# Patient Record
Sex: Male | Born: 1975 | Race: White | Hispanic: No | Marital: Married | Smoking: Former smoker
Health system: Southern US, Community
[De-identification: ages and names within clinical notes are randomized; demographics above are authoritative.]

## PROBLEM LIST (undated history)

## (undated) DIAGNOSIS — K219 Gastro-esophageal reflux disease without esophagitis: Secondary | ICD-10-CM

---
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
## 1997-05-07 HISTORY — PX: MOUTH SURGERY: SHX715

## 2010-09-13 ENCOUNTER — Ambulatory Visit: Payer: Self-pay | Admitting: Unknown Physician Specialty

## 2011-04-06 ENCOUNTER — Ambulatory Visit: Payer: Self-pay | Admitting: Internal Medicine

## 2011-05-08 HISTORY — PX: SHOULDER ARTHROSCOPY: SHX128

## 2011-06-25 ENCOUNTER — Other Ambulatory Visit: Payer: Self-pay | Admitting: Orthopedic Surgery

## 2011-06-25 ENCOUNTER — Encounter (HOSPITAL_BASED_OUTPATIENT_CLINIC_OR_DEPARTMENT_OTHER): Payer: Self-pay | Admitting: *Deleted

## 2011-06-27 ENCOUNTER — Ambulatory Visit (HOSPITAL_BASED_OUTPATIENT_CLINIC_OR_DEPARTMENT_OTHER): Payer: BC Managed Care – PPO | Admitting: Anesthesiology

## 2011-06-27 ENCOUNTER — Encounter (HOSPITAL_BASED_OUTPATIENT_CLINIC_OR_DEPARTMENT_OTHER): Payer: Self-pay | Admitting: Anesthesiology

## 2011-06-27 ENCOUNTER — Ambulatory Visit (HOSPITAL_BASED_OUTPATIENT_CLINIC_OR_DEPARTMENT_OTHER)
Admission: RE | Admit: 2011-06-27 | Discharge: 2011-06-27 | Disposition: A | Discharge status: Home Health | Payer: BC Managed Care – PPO | Source: Ambulatory Visit | Attending: Orthopedic Surgery | Admitting: Orthopedic Surgery

## 2011-06-27 ENCOUNTER — Encounter (HOSPITAL_BASED_OUTPATIENT_CLINIC_OR_DEPARTMENT_OTHER): Payer: Self-pay | Admitting: *Deleted

## 2011-06-27 ENCOUNTER — Encounter (HOSPITAL_BASED_OUTPATIENT_CLINIC_OR_DEPARTMENT_OTHER)
Admission: RE | Disposition: A | Discharge status: Home Health | Payer: Self-pay | Source: Ambulatory Visit | Attending: Orthopedic Surgery

## 2011-06-27 DIAGNOSIS — K219 Gastro-esophageal reflux disease without esophagitis: Secondary | ICD-10-CM | POA: Insufficient documentation

## 2011-06-27 DIAGNOSIS — M67919 Unspecified disorder of synovium and tendon, unspecified shoulder: Secondary | ICD-10-CM | POA: Insufficient documentation

## 2011-06-27 DIAGNOSIS — M719 Bursopathy, unspecified: Secondary | ICD-10-CM | POA: Insufficient documentation

## 2011-06-27 DIAGNOSIS — M25819 Other specified joint disorders, unspecified shoulder: Secondary | ICD-10-CM | POA: Insufficient documentation

## 2011-06-27 HISTORY — DX: Gastro-esophageal reflux disease without esophagitis: K21.9

## 2011-06-27 SURGERY — SHOULDER ARTHROSCOPY WITH SUBACROMIAL DECOMPRESSION
Anesthesia: General | Site: Shoulder | Laterality: Left | Wound class: Clean

## 2011-06-27 MED ORDER — BUPIVACAINE-EPINEPHRINE 0.5% -1:200000 IJ SOLN
INTRAMUSCULAR | Status: DC | PRN
  Administered 2011-06-27: 10 mL

## 2011-06-27 MED ORDER — LACTATED RINGERS IV SOLN: INTRAVENOUS | Status: DC

## 2011-06-27 MED ORDER — MIDAZOLAM HCL 2 MG/2ML IJ SOLN
1.0000 mg | Freq: Once | INTRAMUSCULAR | Status: AC
  Administered 2011-06-27: 2 mg via INTRAVENOUS

## 2011-06-27 MED ORDER — DEXAMETHASONE SODIUM PHOSPHATE 4 MG/ML IJ SOLN
INTRAMUSCULAR | Status: DC | PRN
  Administered 2011-06-27: 10 mg via INTRAVENOUS

## 2011-06-27 MED ORDER — POVIDONE-IODINE 7.5 % EX SOLN: Freq: Once | CUTANEOUS | Status: DC

## 2011-06-27 MED ORDER — SODIUM CHLORIDE 0.9 % IR SOLN
Status: DC | PRN
  Administered 2011-06-27: 13:00:00

## 2011-06-27 MED ORDER — MIDAZOLAM HCL 2 MG/2ML IJ SOLN
2.0000 mg | Freq: Once | INTRAMUSCULAR | Status: AC
  Administered 2011-06-27: 2 mg via INTRAVENOUS

## 2011-06-27 MED ORDER — ONDANSETRON HCL 4 MG/2ML IJ SOLN
INTRAMUSCULAR | Status: DC | PRN
  Administered 2011-06-27: 4 mg via INTRAVENOUS

## 2011-06-27 MED ORDER — EPHEDRINE SULFATE 50 MG/ML IJ SOLN
INTRAMUSCULAR | Status: DC | PRN
  Administered 2011-06-27: 10 mg via INTRAVENOUS

## 2011-06-27 MED ORDER — FENTANYL CITRATE 0.05 MG/ML IJ SOLN
INTRAMUSCULAR | Status: DC | PRN
  Administered 2011-06-27 (×2): 25 ug via INTRAVENOUS
  Administered 2011-06-27: 50 ug via INTRAVENOUS

## 2011-06-27 MED ORDER — FENTANYL CITRATE 0.05 MG/ML IJ SOLN: 25.0000 ug | INTRAMUSCULAR | Status: DC | PRN

## 2011-06-27 MED ORDER — LACTATED RINGERS IV SOLN
INTRAVENOUS | Status: DC
  Administered 2011-06-27 (×3): via INTRAVENOUS

## 2011-06-27 MED ORDER — FENTANYL CITRATE 0.05 MG/ML IJ SOLN
50.0000 ug | Freq: Once | INTRAMUSCULAR | Status: AC
  Administered 2011-06-27: 100 ug via INTRAVENOUS

## 2011-06-27 MED ORDER — PROMETHAZINE HCL 25 MG/ML IJ SOLN: 6.2500 mg | INTRAMUSCULAR | Status: DC | PRN

## 2011-06-27 MED ORDER — LIDOCAINE HCL (CARDIAC) 20 MG/ML IV SOLN
INTRAVENOUS | Status: DC | PRN
  Administered 2011-06-27: 80 mg via INTRAVENOUS

## 2011-06-27 MED ORDER — PROPOFOL 10 MG/ML IV EMUL
INTRAVENOUS | Status: DC | PRN
  Administered 2011-06-27: 200 mg via INTRAVENOUS

## 2011-06-27 MED ORDER — OXYCODONE-ACETAMINOPHEN 5-325 MG PO TABS: 1.0000 | ORAL_TABLET | ORAL | Status: AC | PRN

## 2011-06-27 MED ORDER — SUCCINYLCHOLINE CHLORIDE 20 MG/ML IJ SOLN
INTRAMUSCULAR | Status: DC | PRN
  Administered 2011-06-27: 140 mg via INTRAVENOUS

## 2011-06-27 MED ORDER — METHOCARBAMOL 500 MG PO TABS: 500.0000 mg | ORAL_TABLET | Freq: Four times a day (QID) | ORAL | Status: AC | PRN

## 2011-06-27 MED ORDER — MIDAZOLAM HCL 2 MG/2ML IJ SOLN: 1.0000 mg | Freq: Once | INTRAMUSCULAR | Status: DC

## 2011-06-27 SURGICAL SUPPLY — 69 items
BENZOIN TINCTURE PRP APPL 2/3 (GAUZE/BANDAGES/DRESSINGS) ×3 IMPLANT
BLADE 4.2CUDA (BLADE) ×3 IMPLANT
BLADE CUDA 4.2 (BLADE) IMPLANT
BLADE CUDA 5.5 (BLADE) IMPLANT
BLADE CUDA SHAVER 3.5 (BLADE) IMPLANT
BLADE CUTTER GATOR 3.5 (BLADE) IMPLANT
BLADE FLAT COURSE (BLADE) IMPLANT
BLADE GREAT WHITE 4.2 (BLADE) IMPLANT
BUR OVAL 4.0 (BURR) ×3 IMPLANT
CANISTER SUCT LVC 12 LTR MEDI- (MISCELLANEOUS) ×3 IMPLANT
CANISTER SUCTION 1200CC (MISCELLANEOUS) IMPLANT
CANISTER SUCTION 2500CC (MISCELLANEOUS) ×9 IMPLANT
CLOTH BEACON ORANGE TIMEOUT ST (SAFETY) ×3 IMPLANT
DRAPE LG THREE QUARTER DISP (DRAPES) ×6 IMPLANT
DRAPE SHOULDER BEACH CHAIR (DRAPES) ×3 IMPLANT
DRAPE U-SHAPE 47X51 STRL (DRAPES) ×3 IMPLANT
DRSG ADAPTIC 3X8 NADH LF (GAUZE/BANDAGES/DRESSINGS) ×3 IMPLANT
DRSG EMULSION OIL 3X16 NADH (GAUZE/BANDAGES/DRESSINGS) ×3 IMPLANT
DRSG PAD ABDOMINAL 8X10 ST (GAUZE/BANDAGES/DRESSINGS) ×3 IMPLANT
DURAPREP 26ML APPLICATOR (WOUND CARE) ×3 IMPLANT
ELECT MENISCUS 165MM 90D (ELECTRODE) IMPLANT
ELECT REM PT RETURN 9FT ADLT (ELECTROSURGICAL) ×3
ELECTRODE REM PT RTRN 9FT ADLT (ELECTROSURGICAL) ×2 IMPLANT
GLOVE BIOGEL M 6.5 STRL (GLOVE) ×3 IMPLANT
GLOVE BIOGEL PI IND STRL 6.5 (GLOVE) ×2 IMPLANT
GLOVE BIOGEL PI IND STRL 8 (GLOVE) ×2 IMPLANT
GLOVE BIOGEL PI INDICATOR 6.5 (GLOVE) ×1
GLOVE BIOGEL PI INDICATOR 8 (GLOVE) ×1
GLOVE ECLIPSE 6.0 STRL STRAW (GLOVE) ×3 IMPLANT
GLOVE ECLIPSE 8.0 STRL XLNG CF (GLOVE) ×6 IMPLANT
GLOVE INDICATOR 8.0 STRL GRN (GLOVE) ×3 IMPLANT
GOWN PREVENTION PLUS LG XLONG (DISPOSABLE) ×6 IMPLANT
GOWN STRL REIN XL XLG (GOWN DISPOSABLE) ×6 IMPLANT
IV NS IRRIG 3000ML ARTHROMATIC (IV SOLUTION) ×6 IMPLANT
NDL SAFETY ECLIPSE 18X1.5 (NEEDLE) ×2 IMPLANT
NEEDLE 1/2 CIR CATGUT .05X1.09 (NEEDLE) IMPLANT
NEEDLE HYPO 18GX1.5 BLUNT FILL (NEEDLE) ×3 IMPLANT
NEEDLE HYPO 18GX1.5 SHARP (NEEDLE) ×1
NS IRRIG 500ML POUR BTL (IV SOLUTION) ×3 IMPLANT
PACK ARTHROSCOPY DSU (CUSTOM PROCEDURE TRAY) ×3 IMPLANT
PACK BASIN DAY SURGERY FS (CUSTOM PROCEDURE TRAY) ×3 IMPLANT
PENCIL BUTTON HOLSTER BLD 10FT (ELECTRODE) IMPLANT
SET ARTHROSCOPY TUBING (MISCELLANEOUS) ×1
SET ARTHROSCOPY TUBING LN (MISCELLANEOUS) ×2 IMPLANT
SLING ARM IMMOBILIZER LRG (SOFTGOODS) ×3 IMPLANT
SLING ARM IMMOBILIZER XL (CAST SUPPLIES) ×3 IMPLANT
SPONGE GAUZE 4X4 12PLY (GAUZE/BANDAGES/DRESSINGS) ×3 IMPLANT
SPONGE SURGIFOAM ABS GEL 100 (HEMOSTASIS) IMPLANT
STAPLER VISISTAT 35W (STAPLE) IMPLANT
STRIP CLOSURE SKIN 1/2X4 (GAUZE/BANDAGES/DRESSINGS) ×3 IMPLANT
SUCTION FRAZIER TIP 10 FR DISP (SUCTIONS) IMPLANT
SUT BONE WAX W31G (SUTURE) IMPLANT
SUT ETHIBOND GREEN BRAID 0S 4 (SUTURE) IMPLANT
SUT ETHIBOND NAB CT1 #1 30IN (SUTURE) IMPLANT
SUT ETHILON 4 0 PS 2 18 (SUTURE) IMPLANT
SUT VIC AB 0 CT1 36 (SUTURE) IMPLANT
SUT VIC AB 1 CT1 36 (SUTURE) ×6 IMPLANT
SUT VIC AB 2-0 CT1 27 (SUTURE) ×2
SUT VIC AB 2-0 CT1 TAPERPNT 27 (SUTURE) ×4 IMPLANT
SUT VIC AB 3-0 SH 27 (SUTURE)
SUT VIC AB 3-0 SH 27X BRD (SUTURE) IMPLANT
SYR BULB IRRIGATION 50ML (SYRINGE) ×3 IMPLANT
SYRINGE 10CC LL (SYRINGE) ×3 IMPLANT
TAPE CLOTH SURG 6X10 WHT LF (GAUZE/BANDAGES/DRESSINGS) ×3 IMPLANT
TAPE HYPAFIX 6X30 (GAUZE/BANDAGES/DRESSINGS) ×3 IMPLANT
TOWEL OR 17X24 6PK STRL BLUE (TOWEL DISPOSABLE) ×6 IMPLANT
TUBE CONNECTING 12X1/4 (SUCTIONS) ×3 IMPLANT
WAND 90 DEG TURBOVAC W/CORD (SURGICAL WAND) ×3 IMPLANT
WATER STERILE IRR 500ML POUR (IV SOLUTION) ×3 IMPLANT

## 2011-06-27 NOTE — Discharge Instructions (Signed)
Wear sling for comfort, ice to shoulder as needed.

## 2011-06-27 NOTE — Anesthesia Postprocedure Evaluation (Signed)
  Anesthesia Post-op Note  Patient: Jason Love  Procedure(s) Performed: Procedure(s) (LRB): SHOULDER ARTHROSCOPY WITH SUBACROMIAL DECOMPRESSION (Left)  Patient Location: PACU  Anesthesia Type: General  Level of Consciousness: awake and alert   Airway and Oxygen Therapy: Patient Spontanous Breathing  Post-op Pain: mild  Post-op Assessment: Post-op Vital signs reviewed, Patient's Cardiovascular Status Stable, Respiratory Function Stable, Patent Airway and No signs of Nausea or vomiting  Post-op Vital Signs: stable  Complications: No apparent anesthesia complications

## 2011-06-27 NOTE — Brief Op Note (Signed)
06/27/2011  6:16 PM  PATIENT:  Heywood Bene  36 y.o. male  PRE-OPERATIVE DIAGNOSIS:  left shoulder impingement  POST-OPERATIVE DIAGNOSIS:  left shoulder impingement  PROCEDURE:  Procedure(s) (LRB): SHOULDER ARTHROSCOPY WITH SUBACROMIAL DECOMPRESSION (Left)  SURGEON:  Surgeon(s) and Role:    * Drucilla Schmidt, MD - Primary  PHYSICIAN ASSISTANT:   ASSISTANTS: Mr Idolina Primer  ANESTHESIA:   regional and general  EBL:  Total I/O In: 2130 [P.O.:480; I.V.:1650] Out: -   BLOOD ADMINISTERED:none  DRAINS: none   LOCAL MEDICATIONS USED:  MARCAINE     SPECIMEN:  No Specimen  DISPOSITION OF SPECIMEN:  N/A  COUNTS:  YES  TOURNIQUET:  * No tourniquets in log *  DICTATION: .Other Dictation: Dictation Number [id number]  PLAN OF CARE: Discharge to home after PACU  PATIENT DISPOSITION:  PACU - hemodynamically stable.   Delay start of Pharmacological VTE agent (>24hrs) due to surgical blood loss or risk of bleeding: not applicable

## 2011-06-27 NOTE — Anesthesia Procedure Notes (Addendum)
Procedure Name: Intubation Date/Time: 06/27/2011 12:29 PM Performed by: Lorrin Jackson Pre-anesthesia Checklist: Patient identified, Emergency Drugs available, Suction available and Patient being monitored Patient Re-evaluated:Patient Re-evaluated prior to inductionOxygen Delivery Method: Circle System Utilized Preoxygenation: Pre-oxygenation with 100% oxygen Intubation Type: IV induction Ventilation: Mask ventilation without difficulty Laryngoscope Size: Mac and 4 Grade View: Grade I Tube type: Oral Tube size: 8.0 mm Number of attempts: 1 Airway Equipment and Method: stylet,  oral airway and LTA Placement Confirmation: ETT inserted through vocal cords under direct vision,  positive ETCO2 and breath sounds checked- equal and bilateral Tube secured with: Tape Dental Injury: Teeth and Oropharynx as per pre-operative assessment

## 2011-06-27 NOTE — H&P (Signed)
Demico Ploch is an 36 y.o. male.   Chief Complaint:painful lt shoulder HPI: possible labral tear on mri  Past Medical History  Diagnosis Date  . GERD (gastroesophageal reflux disease)     mild-no meds used    Past Surgical History  Procedure Date  . Knee arthroscopy 1995  . Mouth surgery 1999    History reviewed. No pertinent family history. Social History:  reports that he quit smoking about 6 years ago. He does not have any smokeless tobacco history on file. His alcohol and drug histories not on file.  Allergies: No Known Allergies  Medications Prior to Admission  Medication Dose Route Frequency Provider Last Rate Last Dose  . lactated ringers infusion   Intravenous Continuous Riesa Pope, MD 100 mL/hr at 06/27/11 1115    . midazolam (VERSED) injection 1 mg  1 mg Intravenous Once Gaetano Hawthorne, MD      . midazolam (VERSED) injection 1 mg  1 mg Intravenous Once Gaetano Hawthorne, MD   2 mg at 06/27/11 1155  . midazolam (VERSED) injection 2 mg  2 mg Intravenous Once Gaetano Hawthorne, MD   2 mg at 06/27/11 1145  . povidone-iodine (BETADINE) 7.5 % scrub   Topical Once Drucilla Schmidt, MD       No current outpatient prescriptions on file as of 06/27/2011.    Results for orders placed during the hospital encounter of 06/27/11 (from the past 48 hour(s))  POCT HEMOGLOBIN-HEMACUE     Status: Normal   Collection Time   06/27/11 11:22 AM      Component Value Range Comment   Hemoglobin 16.3  13.0 - 17.0 (g/dL)    No results found.  ROS  Blood pressure 101/55, pulse 60, temperature 97 F (36.1 C), temperature source Oral, resp. rate 20, height 6' (1.829 m), weight 79.379 kg (175 lb), SpO2 100.00%. Physical Exam  Constitutional: He is oriented to person, place, and time. He appears well-developed and well-nourished.  HENT:  Head: Normocephalic and atraumatic.  Right Ear: External ear normal.  Left Ear: External ear normal.  Nose: Nose normal.  Mouth/Throat: Oropharynx is  clear and moist.  Eyes: Conjunctivae and EOM are normal. Pupils are equal, round, and reactive to light.  Neck: Normal range of motion. Neck supple.  Cardiovascular: Normal rate, regular rhythm, normal heart sounds and intact distal pulses.   Respiratory: Effort normal and breath sounds normal.  GI: Soft. Bowel sounds are normal.  Musculoskeletal: Normal range of motion.       Tender rotator cuff;sensation of popping with rotation of arm  Neurological: He is alert and oriented to person, place, and time. He has normal reflexes.  Skin: Skin is warm and dry.  Psychiatric: He has a normal mood and affect. His behavior is normal. Judgment and thought content normal.     Assessment/Plan Rotator cuff impingement and possible labral tear lt shoulder Lt shoulder arthroscopy with possible labral debridement and SAD  Deshawn Skelley P 06/27/2011, 12:12 PM

## 2011-06-27 NOTE — Transfer of Care (Signed)
Immediate Anesthesia Transfer of Care Note  Patient: Jason Love  Procedure(s) Performed: Procedure(s) (LRB): SHOULDER ARTHROSCOPY WITH SUBACROMIAL DECOMPRESSION (Left)  Patient Location: Patient transported to PACU with oxygen via face mask at 4 Liters / Min  Anesthesia Type: General  Level of Consciousness: awake and alert   Airway & Oxygen Therapy: Patient Spontanous Breathing and Patient connected to face mask oxygen  Post-op Assessment: Report given to PACU RN and Post -op Vital signs reviewed and stable  Post vital signs: Reviewed and stable  Dentition: Teeth and oropharynx remain in pre-op condition  Complications: No apparent anesthesia complications

## 2011-06-27 NOTE — Anesthesia Preprocedure Evaluation (Signed)
Anesthesia Evaluation  Patient identified by MRN, date of birth, ID band Patient awake    Reviewed: Allergy & Precautions, H&P , NPO status , Patient's Chart, lab work & pertinent test results  Airway Mallampati: II TM Distance: >3 FB Neck ROM: full    Dental No notable dental hx. (+) Teeth Intact and Dental Advisory Given   Pulmonary neg pulmonary ROS,  clear to auscultation  Pulmonary exam normal       Cardiovascular Exercise Tolerance: Good neg cardio ROS regular Normal    Neuro/Psych Negative Neurological ROS  Negative Psych ROS   GI/Hepatic negative GI ROS, Neg liver ROS,   Endo/Other  Negative Endocrine ROS  Renal/GU negative Renal ROS  Genitourinary negative   Musculoskeletal   Abdominal   Peds  Hematology negative hematology ROS (+)   Anesthesia Other Findings   Reproductive/Obstetrics negative OB ROS                          Anesthesia Physical Anesthesia Plan  ASA: I  Anesthesia Plan: General   Post-op Pain Management:    Induction: Intravenous  Airway Management Planned: Oral ETT  Additional Equipment:   Intra-op Plan:   Post-operative Plan: Extubation in OR  Informed Consent: I have reviewed the patients History and Physical, chart, labs and discussed the procedure including the risks, benefits and alternatives for the proposed anesthesia with the patient or authorized representative who has indicated his/her understanding and acceptance.   Dental Advisory Given  Plan Discussed with: CRNA and Surgeon  Anesthesia Plan Comments:         Anesthesia Quick Evaluation  

## 2011-07-02 NOTE — Op Note (Signed)
NAME:  Jason Love, Jason Love NO.:  [id number]  MEDICAL RECORD NO.:  [id number]  LOCATION:                               FACILITY:  [facility]  PHYSICIAN:  Marlowe Kays, M.D.  DATE OF BIRTH:  03-27-1976  DATE OF PROCEDURE:  06/27/2011 DATE OF DISCHARGE:                              OPERATIVE REPORT   PREOPERATIVE DIAGNOSIS:  Painful left shoulder with: 1. Possible labral tear. 2. Chronic impingement syndrome.  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  OPERATION:  Left shoulder arthroscopy with inspection of the glenohumeral joint (within normal limits) and arthroscopic subacromial decompression.  SURGEON:  Marlowe Kays, M.D.  ASSISTANTDruscilla Brownie. Cherlynn June.  ANESTHESIA:  General preceded by interscalene block.  PATHOLOGY AND JUSTIFICATION FOR PROCEDURE:  Because of painful left shoulder, he had MRI performed, which looked relatively normal with possible labral tear.  Certainly symptomatically, he had rotator cuff pathology with pain and tenderness about the entire rotator cuff as well as loss of function.  PROCEDURE IN DETAIL:  Satisfactory interscalene block by anesthesia, [name] assistance was necessary to help hold the arm, manipulate the arm, and assist with retraction.  Beach-chair position on the sliding frame, left shoulder girdle was prepped with the DuraPrep, draped in sterile field.  Anatomy of the shoulder joint was marked out and for hemostatic purposes, site for posterior and lateral portals and subacromial space were infiltrated with Marcaine with adrenaline.  Left shoulder was draped in a sterile field.  Time-out performed.  First through a posterior portal, the glenohumeral joint was entered with findings being normal.  Representative pictures were taken.  There was no labral tear.  There was a small indentation, which was superficial over the posterior rim of the glenoid, but this  was superficial and there was no evidence for any inflammatory or pathologic changes in terms of inflammation or injury and attempted repair.  I then redirected the scope in the subacromial space.  There was marked inflammatory reaction there with abrasion of the rotator cuff.  Prominent coracoacromial ligament was also noted.  Through a lateral portal, I introduced a 4.2 shaver and began cleaning up the soft tissue in the bursa and then followed this with the ArthroCare 90-degree vaporizer removing soft tissue from the undersurface of the acromion.  We then brought in a 4- mm oval bur and began burring down the very tight impingement in the subacromial space.  I worked back and forth between the bur and the vaporizer with a 4.2 shaver until we had a nice wide decompression with a clean subacromial space.  All fluid possible was removed from the subacromial space and the 2 portals were closed with 4-0 nylon followed by Betadine, Adaptic, dry sterile dressing, and shoulder immobilizer. He tolerated the procedure well, was taken to recovery room in satisfactory condition with no known complications.          ______________________________ Marlowe Kays, M.D.     JA/MEDQ  D:  06/27/2011  T:  06/28/2011  Job:  [id number]

## 2015-02-11 DIAGNOSIS — R030 Elevated blood-pressure reading, without diagnosis of hypertension: Secondary | ICD-10-CM

## 2015-02-11 DIAGNOSIS — K219 Gastro-esophageal reflux disease without esophagitis: Secondary | ICD-10-CM | POA: Insufficient documentation

## 2015-02-11 DIAGNOSIS — IMO0001 Reserved for inherently not codable concepts without codable children: Secondary | ICD-10-CM | POA: Insufficient documentation

## 2016-01-30 DIAGNOSIS — S46019A Strain of muscle(s) and tendon(s) of the rotator cuff of unspecified shoulder, initial encounter: Secondary | ICD-10-CM | POA: Insufficient documentation

## 2016-01-30 DIAGNOSIS — M7710 Lateral epicondylitis, unspecified elbow: Secondary | ICD-10-CM | POA: Insufficient documentation

## 2016-03-06 DIAGNOSIS — E7800 Pure hypercholesterolemia, unspecified: Secondary | ICD-10-CM | POA: Insufficient documentation

## 2016-04-02 ENCOUNTER — Ambulatory Visit (INDEPENDENT_AMBULATORY_CARE_PROVIDER_SITE_OTHER): Payer: BC Managed Care – PPO | Admitting: Orthopaedic Surgery

## 2016-04-02 ENCOUNTER — Encounter (INDEPENDENT_AMBULATORY_CARE_PROVIDER_SITE_OTHER): Payer: Self-pay | Admitting: Orthopaedic Surgery

## 2016-04-02 VITALS — BP 138/76 | HR 68 | Resp 14 | Ht 71.5 in | Wt 185.0 lb

## 2016-04-02 DIAGNOSIS — M25521 Pain in right elbow: Secondary | ICD-10-CM

## 2016-04-02 DIAGNOSIS — G8929 Other chronic pain: Secondary | ICD-10-CM

## 2016-04-02 DIAGNOSIS — M25511 Pain in right shoulder: Secondary | ICD-10-CM

## 2016-04-02 NOTE — Progress Notes (Signed)
Office Visit Note   Patient: Jason Love           Date of Birth: 10/03/1975           MRN: [id number] Visit Date: 04/02/2016              Requested by: No referring provider defined for this encounter. PCP: BABAOFF, Lavada MesiMARC E, MD   Assessment & Plan: Visit Diagnoses: No diagnosis found. Right shoulder pain appears to be localized around the acromioclavicular joint with what appears to be a sprain by MRI scan.  I also think he has a mild lateral epicondylitis the right elbow.. We had a long discussion over 30-40 minutes regarding his problem. He would prefer not to have cortisone injection at either the elbow or the shoulder but monitored response to the Medrol Dosepak which she is been on only several days. I have discuss possible surgery to the shoulder for distal clavicle resection. I have given him samples of Pennsaid gel to apply to the shoulder and the elbow. He like to return in 2 weeks for follow-up evaluation. I agree with no work until then. He was referred by his attorney ., Mr. Tressia MinersDoug Harris.  Follow-Up Instructions: No Follow-up on file.   Orders:  No orders of the defined types were placed in this encounter.  No orders of the defined types were placed in this encounter.     Procedures: No procedures performed   Clinical Data: No additional findings.   Subjective: No chief complaint on file.   Pt fell off a 4' ladder, landed with arms pinned behind him in August 2017 and has not resolved.   MRI completed 12/2015.   Injury was sustained on the job while working at [employer]. Mr. Christell ConstantMoore states going up a ladder. The latter slipped and he fell landing backwards. There was a possible loss of consciousness momentarily but he does not remember hitting his head. He initially had pain in his neck  and both shoulders presently he is having pain in his right elbow and right shoulder with difficulty performing overhead activities. He has not worked in 2 weeks. There is no  light-duty work available for him. He [place]. He continued a course of physical therapy for approximately 1 month. Presently he is on prednisone. He did have an MRI scan performed through [facility]. I have a copy of the report dated 03/12/2016 demonstrating a grade 1 acromioclavicular joint sprain with mild acromioclavicular joint arthritis. There was supraspinatus tendinosis without a rotator cuff tear there was mild edema surrounding the acromioclavicular joint associated with osteophytes.  He also is continued to complain of pain along the lateral aspect of his right elbow particularly with grip in extension. Review of Systems   Objective: Vital Signs: There were no vitals taken for this visit.  Physical Exam  Ortho Exam examination of the right shoulder demonstrates mild to moderate pain at the level of the acromioclavicular joint. He did have a painful overhead arc of motion without loss of motion. There was mild positive impingement on external rotation. Biceps was intact. Skin was intact. Neurovascular exam was intact.  There was local tenderness at the lateral aspect of the right elbow with increased pain with grip and extension.  Specialty Comments:  No specialty comments available.  Imaging: No results found.   PMFS History: There are no active problems to display for this patient.  Past Medical History:  Diagnosis Date  .  GERD (gastroesophageal reflux disease)    mild-no meds used    No family history on file.  Past Surgical History:  Procedure Laterality Date  . KNEE ARTHROSCOPY  1995  . MOUTH SURGERY  1999   Social History   Occupational History  . Not on file.   Social History Main Topics  . Smoking status: Former Smoker    Quit date: 04/23/2005  . Smokeless tobacco: Not on file  . Alcohol use Not on file     Comment: rare  . Drug use: Unknown  . Sexual activity: Not on file

## 2016-04-20 ENCOUNTER — Ambulatory Visit (INDEPENDENT_AMBULATORY_CARE_PROVIDER_SITE_OTHER): Payer: BC Managed Care – PPO | Admitting: Orthopaedic Surgery

## 2016-04-20 ENCOUNTER — Encounter (INDEPENDENT_AMBULATORY_CARE_PROVIDER_SITE_OTHER): Payer: Self-pay | Admitting: Orthopaedic Surgery

## 2016-04-20 VITALS — BP 138/76 | HR 70 | Resp 14 | Ht 71.5 in | Wt 185.0 lb

## 2016-04-20 DIAGNOSIS — M25511 Pain in right shoulder: Secondary | ICD-10-CM | POA: Diagnosis not present

## 2016-04-20 DIAGNOSIS — G8929 Other chronic pain: Secondary | ICD-10-CM

## 2016-04-20 NOTE — Progress Notes (Signed)
   Office Visit Note   Patient: Jason Love           Date of Birth: 06/07/1975           MRN: [id number] Visit Date: 04/20/2016              Requested by: Kandyce RudMarcus Babaoff, MD [id number] [facility] [place] [facility] [place], [place] [id number] PCP: Rozanna BoxBABAOFF, MARC E, MD   Assessment & Plan: Visit Diagnoses: Right shoulder impingement with possible partial rotator cuff tear  Plan: Mr. Christell ConstantMoore will continue to be followed by his workman's comp orthopedist [place] with the planned physical therapy. He continues to be out of work through his other orthopedist. I agree. He would like to return for further evaluation after physical therapy  Follow-Up Instructions: No Follow-up on file.   Orders:  No orders of the defined types were placed in this encounter.  No orders of the defined types were placed in this encounter.     Procedures: No procedures performed   Clinical Data: No additional findings.   Subjective: No chief complaint on file.   Pt here with recurring Right shoulder pain. Workman's Comp [facility]. Disk provided  I did review the new MRI scan with contrast. There appears to be a small tear of the supraspinatus that I suspect is partial thickness. I did not see a labral tear.  Review of Systems   Objective: Vital Signs: There were no vitals taken for this visit.  Physical Exam  Ortho Exam right shoulder exam is consistent with impingement. There was pain on the extreme of external rotation. There is a painful arc with overhead motion. As of empty can testing. No weakness. Neurovascular exam intact. No neck pain.  Specialty Comments:  No specialty comments available.  Imaging: No results found.   PMFS History: There are no active problems to display for this patient.  Past Medical History:  Diagnosis Date  . GERD (gastroesophageal reflux disease)    mild-no meds used    No family  history on file.  Past Surgical History:  Procedure Laterality Date  . KNEE ARTHROSCOPY  1995  . MOUTH SURGERY  1999   Social History   Occupational History  . Not on file.   Social History Main Topics  . Smoking status: Former Smoker    Quit date: 04/23/2005  . Smokeless tobacco: Never Used  . Alcohol use Not on file     Comment: rare  . Drug use: No  . Sexual activity: Yes

## 2016-08-22 DIAGNOSIS — M25519 Pain in unspecified shoulder: Secondary | ICD-10-CM | POA: Insufficient documentation

## 2016-09-21 ENCOUNTER — Ambulatory Visit (INDEPENDENT_AMBULATORY_CARE_PROVIDER_SITE_OTHER): Payer: BC Managed Care – PPO | Admitting: Orthopaedic Surgery

## 2016-09-21 ENCOUNTER — Encounter (INDEPENDENT_AMBULATORY_CARE_PROVIDER_SITE_OTHER): Payer: Self-pay | Admitting: Orthopaedic Surgery

## 2016-09-21 ENCOUNTER — Encounter (INDEPENDENT_AMBULATORY_CARE_PROVIDER_SITE_OTHER): Payer: Self-pay

## 2016-09-21 VITALS — BP 140/74 | HR 97 | Resp 14 | Ht 72.0 in | Wt 185.0 lb

## 2016-09-21 DIAGNOSIS — G8929 Other chronic pain: Secondary | ICD-10-CM | POA: Diagnosis not present

## 2016-09-21 DIAGNOSIS — M25511 Pain in right shoulder: Secondary | ICD-10-CM

## 2016-09-21 NOTE — Progress Notes (Signed)
Office Visit Note   Patient: Jason Love           Date of Birth: 10/05/1975           MRN: [id number] Visit Date: 09/21/2016              Requested by: Kandyce RudBabaoff, Marcus, MD [address] [facility] [place] [facility] [place], [place] [id number] PCP: Kandyce RudBabaoff, Marcus, MD   Assessment & Plan: Visit Diagnoses:  1. 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 Workmen's Comp. injury  Plan: I recommend right shoulder surgery do include an arthroscopic subacromial decompression, distal clavicle resection and possible mini open rotator cuff tear repair. This is discussed this in detail with him regarding the surgery outpatient nature, type of anesthesia. I have also discussed the potential for significant relief of pain versus incomplete relief of pain. I think he fully understands the potential limitations and would like to proceed.Marland Kitchen. He is aware that because this is a workman's comp injury that it needs to be processed through [employer]. and/or his attorney.  Follow-Up Instructions: Return will schedule surgery.   Orders:  No orders of the defined types were placed in this encounter.  No orders of the defined types were placed in this encounter.     Procedures: No procedures performed   Clinical Data: No additional findings.   Subjective: Chief Complaint  Patient presents with  . Right Shoulder - Pain    Jason Love is a 41 y o that presents with Right shoulder impingement with possible partial rotator cuff tear. Workman's Comp.Ready to discuss R shoulder shoulder surgery. [place]  Jason Love sustained an on-the-job injury to his left shoulder in August 2017. He continues to have pain to the point of compromise. He recently returned to work and felt that he could not perform his regular activities because of the shoulder pain. He's been  through a course of physical therapy, try over-the-counter medicines. He had an MRI scan that demonstrated partial rotator cuff tear and degenerative changes at the acromioclavicular joint. He's having difficulty raising his arm over his head and even sleeping on that shoulder. Denies any neck pain or numbness or tingling distally. He is at the point where he wants to discuss "surgery". He has been followed [place]oped[place] but wishes to transfer his care to our office.  HPI  Review of Systems   Objective: Vital Signs: BP 140/74   Pulse 97   Resp 14   Ht 6' (1.829 m)   Wt 185 lb (83.9 kg)   BMI 25.09 kg/m   Physical Exam  Ortho Exam right shoulder with full passive and active overhead motion. Circulators motion raising his arm and flexion. Abduction is uncomfortable along the anterior lateral subacromial region. Some pain at the acromioclavicular joint. Biceps intact. Good grip and good release. Good strength. Difficulty maintaining his arm in flexion and abduction related to his pain  Specialty Comments:  No specialty comments available.  Imaging: No results found.   PMFS History: Patient Active Problem List   Diagnosis Date Noted  . Shoulder pain 08/22/2016  . Pure hypercholesterolemia 03/06/2016  . Strain of rotator cuff capsule 01/30/2016  . Lateral epicondylitis 01/30/2016  . Blood pressure elevated 02/11/2015  . GERD without esophagitis 02/11/2015   Past Medical History:  Diagnosis Date  . GERD (gastroesophageal reflux disease)  mild-no meds used    No family history on file.  Past Surgical History:  Procedure Laterality Date  . KNEE ARTHROSCOPY  1995  . MOUTH SURGERY  1999   Social History   Occupational History  . Not on file.   Social History Main Topics  . Smoking status: Former Smoker    Quit date: 04/23/2005  . Smokeless tobacco: Never Used  . Alcohol use Not on file     Comment: rare  . Drug use: No  . Sexual activity: Yes      Valeria Batman, MD   Note - This record has been created using [employer].  Chart creation errors have been sought, but may not always  have been located. Such creation errors do not reflect on  the standard of medical care.

## 2016-12-04 ENCOUNTER — Telehealth (INDEPENDENT_AMBULATORY_CARE_PROVIDER_SITE_OTHER): Payer: Self-pay | Admitting: Orthopaedic Surgery

## 2016-12-04 NOTE — Telephone Encounter (Signed)
Patient would like doctor to order a nerve conduction study test, and [facility] Anne HahnWillis Fax#[phone]. Please call patient will questions.

## 2016-12-05 NOTE — Telephone Encounter (Signed)
THis is a WC patient. Please advise

## 2016-12-18 ENCOUNTER — Telehealth (INDEPENDENT_AMBULATORY_CARE_PROVIDER_SITE_OTHER): Payer: Self-pay | Admitting: Orthopaedic Surgery

## 2016-12-18 NOTE — Telephone Encounter (Signed)
Called pt and told him there was no mention of EMG in last note. Called pt and told him this. He was confused as to which dr he talked with about this.

## 2016-12-18 NOTE — Telephone Encounter (Signed)
Patient would like to know status of Nerve Conduction Study. Please call to advise.

## 2017-01-02 DIAGNOSIS — R202 Paresthesia of skin: Secondary | ICD-10-CM | POA: Insufficient documentation

## 2017-01-24 DIAGNOSIS — G5621 Lesion of ulnar nerve, right upper limb: Secondary | ICD-10-CM | POA: Insufficient documentation

## 2017-04-05 ENCOUNTER — Ambulatory Visit (INDEPENDENT_AMBULATORY_CARE_PROVIDER_SITE_OTHER): Payer: BC Managed Care – PPO | Admitting: Orthopaedic Surgery

## 2017-04-05 ENCOUNTER — Encounter (INDEPENDENT_AMBULATORY_CARE_PROVIDER_SITE_OTHER): Payer: Self-pay | Admitting: Orthopaedic Surgery

## 2017-04-05 VITALS — BP 127/70 | HR 77 | Resp 14 | Ht 70.0 in | Wt 185.0 lb

## 2017-04-05 DIAGNOSIS — G8929 Other chronic pain: Secondary | ICD-10-CM | POA: Insufficient documentation

## 2017-04-05 DIAGNOSIS — M25511 Pain in right shoulder: Secondary | ICD-10-CM | POA: Diagnosis not present

## 2017-04-05 DIAGNOSIS — G5621 Lesion of ulnar nerve, right upper limb: Secondary | ICD-10-CM | POA: Diagnosis not present

## 2017-04-05 NOTE — Progress Notes (Signed)
Office Visit Note   Patient: Jason Love           Date of Birth: 09/04/1975           MRN: [id number] Visit Date: 04/05/2017              Requested by: Kandyce RudBabaoff, Marcus, MD [address] [facility] [place] [facility] [place], [place] [address] PCP: Kandyce RudBabaoff, Marcus, MD   Assessment & Plan: Visit Diagnoses:  1. Chronic right shoulder pain   2. Cubital tunnel syndrome on right     Plan: Chronic problem with right shoulder as previously identified. This is a workman's comp injury. Recently evaluated at [facility] for cubital tunnel syndrome. Had EMGs and nerve conduction studies consistent with a "mild": Nerve compression. After long discussion Jason Love would like to proceed with the shoulder surgery as previously outlined and release of the ulnar nerve at the right elbow. This was also suggested by the [facility]oped[facility]. Surgery would include an arthroscopic subacromial decompression distal clavicle resection. There is a partial rotator cuff tear this may be patched or debrided or both. Might require an open cuff tear repair  Follow-Up Instructions: Return will schedu;le surgery.   Orders:  No orders of the defined types were placed in this encounter.  No orders of the defined types were placed in this encounter.     Procedures: No procedures performed   Clinical Data: No additional findings.   Subjective: Chief Complaint  Patient presents with  . Right Shoulder - Pain    Jason Love is a 41 y o that presents with R elbow pain with chronic right shoulder pain. [facility], referred to Dr. Luiz BlareGraves and dr said R ulna nerve was pinched. Pt also had EMG for r hand numbness.  . Right Elbow - Pain  Workman's comp injury to right upper extremity as previously outlined. Has evidence of impingement and a partial rotator cuff tear by MRI scan. I had previously suggested an arthroscopic subacromial decompression and distal clavicle resection and  debridement of the partial tear versus open repair. Jason Love would like to proceed. He also is been evaluated at [facility] problems having one the medial aspect of his right elbow consistent with cubital tunnel syndrome. EMGs and nerve conduction studies confirm the above .he is having trouble sleeping and raising his arm over his head. Has been unable to work.  HPI  Review of Systems  Constitutional: Negative for fatigue.  HENT: Negative for hearing loss.   Respiratory: Negative for apnea, chest tightness and shortness of breath.   Cardiovascular: Negative for chest pain, palpitations and leg swelling.  Gastrointestinal: Negative for blood in stool, constipation and diarrhea.  Genitourinary: Negative for difficulty urinating.  Musculoskeletal: Positive for neck stiffness. Negative for arthralgias, back pain, joint swelling, myalgias and neck pain.  Neurological: Negative for weakness, numbness and headaches.  Hematological: Does not bruise/bleed easily.  Psychiatric/Behavioral: Negative for sleep disturbance. The patient is not nervous/anxious.      Objective: Vital Signs: BP 127/70   Pulse 77   Resp 14   Ht 5\' 10"  (1.778 m)   Wt 185 lb (83.9 kg)   BMI 26.54 kg/m   Physical Exam  Ortho Exam awake alert and oriented 3. Comfortable sitting. Positive impingement and empty can testing right shoulder. Painful overhead arc of motion but is able to place his right arm fully overhead. With grip and good release. Positive Tinel's over the  ulnar nerve medial aspect of the right elbow. Has good sensibility to the fingers and has an active abduction and abduction of all of his fingers. No evidence of subluxation of the ulnar nerve at the elbow  Specialty Comments:  No specialty comments available.  Imaging: No results found.   PMFS History: Patient Active Problem List   Diagnosis Date Noted  . Chronic right shoulder pain 04/05/2017  . Cubital tunnel syndrome on right  01/24/2017  . Paresthesias in right hand 01/02/2017  . Shoulder pain 08/22/2016  . Pure hypercholesterolemia 03/06/2016  . Strain of rotator cuff capsule 01/30/2016  . Lateral epicondylitis 01/30/2016  . Blood pressure elevated 02/11/2015  . GERD without esophagitis 02/11/2015   Past Medical History:  Diagnosis Date  . GERD (gastroesophageal reflux disease)    mild-no meds used    History reviewed. No pertinent family history.  Past Surgical History:  Procedure Laterality Date  . KNEE ARTHROSCOPY  1995  . MOUTH SURGERY  1999   Social History   Occupational History  . Not on file  Tobacco Use  . Smoking status: Former Smoker    Last attempt to quit: 04/23/2005    Years since quitting: 11.9  . Smokeless tobacco: Never Used  Substance and Sexual Activity  . Alcohol use: Not on file    Comment: rare  . Drug use: No  . Sexual activity: Yes

## 2017-05-03 ENCOUNTER — Inpatient Hospital Stay (INDEPENDENT_AMBULATORY_CARE_PROVIDER_SITE_OTHER): Payer: BC Managed Care – PPO | Admitting: Orthopaedic Surgery

## 2017-05-17 NOTE — H&P (Addendum)
Norlene Campbell, MD   Jacqualine Code, PA-C [address], [place], [place]  [address]                             [phone]   ORTHOPAEDIC HISTORY & PHYSICAL  Jason Love MRN:  [id number] DOB/SEX:  August 18, 1975/male  CHIEF COMPLAINT:  Painful right shoulder and numbness right hand  HISTORY: 42 year old male who has been seen for chronic problems with the right shoulder for that of impingement and possible cuff tear. He did have initial on-the-job injury [employer] when he was going up a ladder and the latter slipped and he felt landing backwards. Apparently there was a question of momentary unconsciousness. Initially had pain in his neck as well as both shoulders and his right elbow. Chart having problems with his right elbow and right shoulder especially with performing overhead activities. She was seen in the orthopedic clinic in [place] back in 2017. Course of physical therapy was performed and also placed on prednisone. MRI scan at no monitor revealed on 03/12/2016 a grade 1 before meals separation with mild acromioclavicular joint arthritis. There was supraspinatus tendinosis without rotator cuff tear. There was mild edema surrounding the before meals joint so she with osteophytes. Treated her pain and discomfort. Was evaluated at that time Also has been seen previously for cubital tunnel syndrome at [facility]. EMGs were consistent with a mild nerve compression. He had been evaluated on 04/02/2016 and continued as does back and also was given samples of Pennsaid to apply to the shoulder and elbow. He was taken out of work at that time.  Is continue out of work. On 09/21/2016 a suggestion was made to do arthroscopic subacromial decompression with distal clavicle excision and possible mini open rotator cuff repair this had to be approved by workman's comp. There is also seen by Dr. Sanjuana Letters who felt that was cubital tunnel syndrome also.  Because of continued symptoms is now  indicated for a right shoulder SA, DCR, mini open rotator cuff repair, and cubital tunnel release.   PAST MEDICAL HISTORY: Patient Active Problem List   Diagnosis Date Noted  . Chronic right shoulder pain 04/05/2017  . Cubital tunnel syndrome on right 01/24/2017  . Paresthesias in right hand 01/02/2017  . Shoulder pain 08/22/2016  . Pure hypercholesterolemia 03/06/2016  . Strain of rotator cuff capsule 01/30/2016  . Lateral epicondylitis 01/30/2016  . Blood pressure elevated 02/11/2015  . GERD without esophagitis 02/11/2015   Past Medical History:  Diagnosis Date  . GERD (gastroesophageal reflux disease)    mild-no meds used   Past Surgical History:  Procedure Laterality Date  . KNEE ARTHROSCOPY  1995  . MOUTH SURGERY  1999  . SHOULDER ARTHROSCOPY Left 2013     MEDICATIONS:   No medications prior to admission.    ALLERGIES:  No Known Allergies  REVIEW OF SYSTEMS:  A comprehensive review of systems was negative except for: Musculoskeletal: positive for neck pain   FAMILY HISTORY:  No family history on file.  SOCIAL HISTORY:   Social History   Tobacco Use  . Smoking status: Former Smoker    Last attempt to quit: 1999    Years since quitting: 20.0  . Smokeless tobacco: Never Used  Substance Use Topics  . Alcohol use: Not on file    Comment: rare      EXAMINATION: Vital signs in last 24 hours:    Head is  normocephalic.   Eyes:  Pupils equal, round and reactive to light and accommodation.  Extraocular intact. ENT: Ears, nose, and throat were benign.   Neck: supple, no bruits were noted.   Chest: good expansion.   Lungs: essentially clear.   Cardiac: regular rhythm and rate, normal S1, S2.  No murmurs appreciated. Pulses :  2+ bilateral and symmetric in upper extremities. Abdomen is scaphoid, soft, nontender, no masses palpable, normal bowel sounds present. CNS:  He is oriented x3 and cranial nerves II-XII grossly intact. Breast, rectal, and genital  exams: not performed and not indicated for an orthopedic evaluation. Musculoskeletal: exam reveals positive empty can testing. Painful overhead arc motion. Positive Tinel over the ulnar nerve at the medial elbow Good sensation distally.   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 Right cubital tunnel syndrome   PLAN: Plan for right arthroscopic subacromial decompression with distal clavicle excision with probable mini open rotator cuff repair. Also we will do an ulnar nerve neural lysis of the right elbow.  The procedure,  risks, and benefits of surgery were presented and reviewed. The risks including but not limited to infection, blood clots, vascular and nerve injury, stiffness,  among others were discussed. The patient acknowledged the explanation, agreed to proceed.   Oris DroneBrian D. Aleda Granaetrarca, PA-C [facility] [phone]  05/23/2017 4:14 PM

## 2017-05-21 NOTE — Pre-Procedure Instructions (Signed)
Lucila MaineRichard B Shedlock  05/21/2017      [id number] [place], [place] - [address] [facility] [address] [place] [address] Phone: [phone] Fax: [phone]    Your procedure is scheduled on January 22  [facility] Admitting at [phone] A.M.  Call this number if you have problems the morning of surgery:  [phone]   Remember:  Do not eat food or drink liquids after midnight.  Continue all medications as directed by your physician except follow these medication instructions before surgery below    Take these medicines the morning of surgery with A SIP OF WATER  esomeprazole (NEXIUM)     Do not wear jewelry  Do not wear lotions, powders, or cologne, or deodorant.  Men may shave face and neck.  Do not bring valuables to the hospital.  [facility] is not responsible for any belongings or valuables.  Contacts, dentures or bridgework may not be worn into surgery.  Leave your suitcase in the car.  After surgery it may be brought to your room.  For patients admitted to the hospital, discharge time will be determined by your treatment team.  Patients discharged the day of surgery will not be allowed to drive home.    Special instructions:   [facility]- Preparing For Surgery  Before surgery, you can play an important role. Because skin is not sterile, your skin needs to be as free of germs as possible. You can reduce the number of germs on your skin by washing with CHG (chlorahexidine gluconate) Soap before surgery.  CHG is an antiseptic cleaner which kills germs and bonds with the skin to continue killing germs even after washing.  Please do not use if you have an allergy to CHG or antibacterial soaps. If your skin becomes reddened/irritated stop using the CHG.  Do not shave (including legs and underarms) for at least 48 hours prior to first CHG shower. It is OK to shave your face.  Please follow these instructions  carefully.   1. Shower the NIGHT BEFORE SURGERY and the MORNING OF SURGERY with CHG.   2. If you chose to wash your hair, wash your hair first as usual with your normal shampoo.  3. After you shampoo, rinse your hair and body thoroughly to remove the shampoo.  4. Use CHG as you would any other liquid soap. You can apply CHG directly to the skin and wash gently with a scrungie or a clean washcloth.   5. Apply the CHG Soap to your body ONLY FROM THE NECK DOWN.  Do not use on open wounds or open sores. Avoid contact with your eyes, ears, mouth and genitals (private parts). Wash Face and genitals (private parts)  with your normal soap.  6. Wash thoroughly, paying special attention to the area where your surgery will be performed.  7. Thoroughly rinse your body with warm water from the neck down.  8. DO NOT shower/wash with your normal soap after using and rinsing off the CHG Soap.  9. Pat yourself dry with a CLEAN TOWEL.  10. Wear CLEAN PAJAMAS to bed the night before surgery, wear comfortable clothes the morning of surgery  11. Place CLEAN SHEETS on your bed the night of your first shower and DO NOT SLEEP WITH PETS.    Day of Surgery: Do not apply any deodorants/lotions. Please wear clean clothes to the hospital/surgery center.      Please read over  the following fact sheets that you were given.

## 2017-05-22 ENCOUNTER — Encounter (HOSPITAL_COMMUNITY): Payer: Self-pay

## 2017-05-22 ENCOUNTER — Encounter (HOSPITAL_COMMUNITY)
Admission: RE | Admit: 2017-05-22 | Discharge: 2017-05-22 | Disposition: A | Discharge status: Home / Self Care | Payer: BC Managed Care – PPO | Source: Ambulatory Visit | Attending: Orthopaedic Surgery | Admitting: Orthopaedic Surgery

## 2017-05-22 ENCOUNTER — Other Ambulatory Visit: Payer: Self-pay

## 2017-05-22 DIAGNOSIS — Z01812 Encounter for preprocedural laboratory examination: Secondary | ICD-10-CM | POA: Diagnosis present

## 2017-05-22 LAB — CBC
HCT: 48.9 % (ref 39.0–52.0)
Hemoglobin: 17.3 g/dL — ABNORMAL HIGH (ref 13.0–17.0)
MCH: 31.5 pg (ref 26.0–34.0)
MCHC: 35.4 g/dL (ref 30.0–36.0)
MCV: 88.9 fL (ref 78.0–100.0)
PLATELETS: 235 10*3/uL (ref 150–400)
RBC: 5.5 MIL/uL (ref 4.22–5.81)
RDW: 12.1 % (ref 11.5–15.5)
WBC: 7.3 10*3/uL (ref 4.0–10.5)

## 2017-05-22 NOTE — Progress Notes (Signed)
PCP - Dr. Madelin RearBaboff at [facility] [place] Cardiologist - patient denies  Chest x-ray - n/a EKG - patient denies Stress Test - patient denies ECHO - patient denies Cardiac Cath - patient denies  Sleep Study - patient denies  Anesthesia review: n/a  Patient denies shortness of breath, fever, cough and chest pain at PAT appointment   Patient verbalized understanding of instructions that were given to them at the PAT appointment. Patient was also instructed that they will need to review over the PAT instructions again at home before surgery.

## 2017-05-28 ENCOUNTER — Encounter (HOSPITAL_COMMUNITY)
Admission: RE | Disposition: A | Discharge status: Home / Self Care | Payer: Self-pay | Source: Ambulatory Visit | Attending: Orthopaedic Surgery

## 2017-05-28 ENCOUNTER — Encounter (HOSPITAL_COMMUNITY): Payer: Self-pay | Admitting: Certified Registered Nurse Anesthetist

## 2017-05-28 ENCOUNTER — Ambulatory Visit (HOSPITAL_COMMUNITY): Payer: BC Managed Care – PPO | Admitting: Certified Registered Nurse Anesthetist

## 2017-05-28 ENCOUNTER — Encounter: Payer: Self-pay | Admitting: Orthopaedic Surgery

## 2017-05-28 ENCOUNTER — Ambulatory Visit (HOSPITAL_COMMUNITY)
Admission: RE | Admit: 2017-05-28 | Discharge: 2017-05-28 | Disposition: A | Discharge status: Home / Self Care | Payer: BC Managed Care – PPO | Source: Ambulatory Visit | Attending: Orthopaedic Surgery | Admitting: Orthopaedic Surgery

## 2017-05-28 DIAGNOSIS — E7800 Pure hypercholesterolemia, unspecified: Secondary | ICD-10-CM | POA: Diagnosis not present

## 2017-05-28 DIAGNOSIS — K219 Gastro-esophageal reflux disease without esophagitis: Secondary | ICD-10-CM | POA: Diagnosis not present

## 2017-05-28 DIAGNOSIS — G5621 Lesion of ulnar nerve, right upper limb: Secondary | ICD-10-CM | POA: Diagnosis present

## 2017-05-28 DIAGNOSIS — M7541 Impingement syndrome of right shoulder: Secondary | ICD-10-CM | POA: Diagnosis not present

## 2017-05-28 DIAGNOSIS — M19011 Primary osteoarthritis, right shoulder: Secondary | ICD-10-CM | POA: Diagnosis not present

## 2017-05-28 DIAGNOSIS — M75111 Incomplete rotator cuff tear or rupture of right shoulder, not specified as traumatic: Secondary | ICD-10-CM | POA: Diagnosis not present

## 2017-05-28 DIAGNOSIS — Z87891 Personal history of nicotine dependence: Secondary | ICD-10-CM | POA: Diagnosis not present

## 2017-05-28 HISTORY — PX: SHOULDER ARTHROSCOPY: SHX128

## 2017-05-28 HISTORY — PX: ULNAR NERVE TRANSPOSITION: SHX2595

## 2017-05-28 SURGERY — ARTHROSCOPY, SHOULDER
Anesthesia: General | Laterality: Right

## 2017-05-28 MED ORDER — LIDOCAINE-EPINEPHRINE 1 %-1:100000 IJ SOLN
INTRAMUSCULAR | Status: AC
  Filled 2017-05-28: qty 1

## 2017-05-28 MED ORDER — BUPIVACAINE-EPINEPHRINE (PF) 0.5% -1:200000 IJ SOLN
INTRAMUSCULAR | Status: DC | PRN
  Administered 2017-05-28 (×6): 5 mL via PERINEURAL

## 2017-05-28 MED ORDER — ONDANSETRON HCL 4 MG/2ML IJ SOLN
INTRAMUSCULAR | Status: DC | PRN
  Administered 2017-05-28: 4 mg via INTRAVENOUS

## 2017-05-28 MED ORDER — SUGAMMADEX SODIUM 200 MG/2ML IV SOLN
INTRAVENOUS | Status: DC | PRN
  Administered 2017-05-28: 200 mg via INTRAVENOUS

## 2017-05-28 MED ORDER — MIDAZOLAM HCL 2 MG/2ML IJ SOLN
INTRAMUSCULAR | Status: AC
  Administered 2017-05-28: 2 mg via INTRAVENOUS
  Filled 2017-05-28: qty 2

## 2017-05-28 MED ORDER — PROMETHAZINE HCL 25 MG/ML IJ SOLN
6.2500 mg | INTRAMUSCULAR | Status: DC | PRN
Start: 2017-05-28 — End: 2017-05-28

## 2017-05-28 MED ORDER — SODIUM CHLORIDE 0.9 % IV SOLN: 75.0000 mL/h | INTRAVENOUS | Status: DC

## 2017-05-28 MED ORDER — DEXAMETHASONE SODIUM PHOSPHATE 10 MG/ML IJ SOLN
INTRAMUSCULAR | Status: AC
  Filled 2017-05-28: qty 1

## 2017-05-28 MED ORDER — LIDOCAINE 2% (20 MG/ML) 5 ML SYRINGE
INTRAMUSCULAR | Status: DC | PRN
  Administered 2017-05-28: 100 mg via INTRAVENOUS

## 2017-05-28 MED ORDER — FENTANYL CITRATE (PF) 100 MCG/2ML IJ SOLN
INTRAMUSCULAR | Status: AC
  Administered 2017-05-28: 100 ug via INTRAVENOUS
  Filled 2017-05-28: qty 2

## 2017-05-28 MED ORDER — LACTATED RINGERS IV SOLN
INTRAVENOUS | Status: DC
  Administered 2017-05-28 (×2): via INTRAVENOUS

## 2017-05-28 MED ORDER — SUGAMMADEX SODIUM 200 MG/2ML IV SOLN
INTRAVENOUS | Status: AC
  Filled 2017-05-28: qty 2

## 2017-05-28 MED ORDER — PROPOFOL 10 MG/ML IV BOLUS
INTRAVENOUS | Status: AC
  Filled 2017-05-28: qty 20

## 2017-05-28 MED ORDER — MEPERIDINE HCL 25 MG/ML IJ SOLN
6.2500 mg | INTRAMUSCULAR | Status: DC | PRN
Start: 2017-05-28 — End: 2017-05-28

## 2017-05-28 MED ORDER — FENTANYL CITRATE (PF) 100 MCG/2ML IJ SOLN
INTRAMUSCULAR | Status: DC | PRN
  Administered 2017-05-28: 100 ug via INTRAVENOUS
  Administered 2017-05-28: 50 ug via INTRAVENOUS

## 2017-05-28 MED ORDER — LIDOCAINE 2% (20 MG/ML) 5 ML SYRINGE
INTRAMUSCULAR | Status: AC
  Filled 2017-05-28: qty 5

## 2017-05-28 MED ORDER — SODIUM CHLORIDE 0.9 % IR SOLN
Status: DC | PRN
  Administered 2017-05-28: 6000 mL

## 2017-05-28 MED ORDER — PHENYLEPHRINE 40 MCG/ML (10ML) SYRINGE FOR IV PUSH (FOR BLOOD PRESSURE SUPPORT)
PREFILLED_SYRINGE | INTRAVENOUS | Status: DC | PRN
  Administered 2017-05-28 (×3): 80 ug via INTRAVENOUS
  Administered 2017-05-28: 40 ug via INTRAVENOUS

## 2017-05-28 MED ORDER — PROPOFOL 10 MG/ML IV BOLUS
INTRAVENOUS | Status: DC | PRN
  Administered 2017-05-28: 200 mg via INTRAVENOUS

## 2017-05-28 MED ORDER — ONDANSETRON HCL 4 MG/2ML IJ SOLN
INTRAMUSCULAR | Status: AC
  Filled 2017-05-28: qty 2

## 2017-05-28 MED ORDER — FENTANYL CITRATE (PF) 100 MCG/2ML IJ SOLN
100.0000 ug | Freq: Once | INTRAMUSCULAR | Status: AC
  Administered 2017-05-28: 100 ug via INTRAVENOUS

## 2017-05-28 MED ORDER — ROCURONIUM BROMIDE 50 MG/5ML IV SOSY
PREFILLED_SYRINGE | INTRAVENOUS | Status: DC | PRN
  Administered 2017-05-28: 50 mg via INTRAVENOUS

## 2017-05-28 MED ORDER — OXYCODONE-ACETAMINOPHEN 5-325 MG PO TABS: 1.0000 | ORAL_TABLET | ORAL | 0 refills | Status: DC | PRN

## 2017-05-28 MED ORDER — ACETAMINOPHEN 10 MG/ML IV SOLN
1000.0000 mg | Freq: Four times a day (QID) | INTRAVENOUS | Status: DC
  Administered 2017-05-28: 1000 mg via INTRAVENOUS
  Filled 2017-05-28: qty 100

## 2017-05-28 MED ORDER — HYDROMORPHONE HCL 1 MG/ML IJ SOLN
0.2500 mg | INTRAMUSCULAR | Status: DC | PRN
Start: 2017-05-28 — End: 2017-05-28

## 2017-05-28 MED ORDER — CEFAZOLIN SODIUM-DEXTROSE 2-4 GM/100ML-% IV SOLN
2.0000 g | INTRAVENOUS | Status: AC
  Administered 2017-05-28: 2 g via INTRAVENOUS
  Filled 2017-05-28: qty 100

## 2017-05-28 MED ORDER — MIDAZOLAM HCL 2 MG/2ML IJ SOLN
2.0000 mg | Freq: Once | INTRAMUSCULAR | Status: AC
  Administered 2017-05-28: 2 mg via INTRAVENOUS

## 2017-05-28 MED ORDER — DEXAMETHASONE SODIUM PHOSPHATE 10 MG/ML IJ SOLN
INTRAMUSCULAR | Status: DC | PRN
  Administered 2017-05-28: 10 mg via INTRAVENOUS

## 2017-05-28 MED ORDER — ROCURONIUM BROMIDE 10 MG/ML (PF) SYRINGE
PREFILLED_SYRINGE | INTRAVENOUS | Status: AC
  Filled 2017-05-28: qty 5

## 2017-05-28 MED ORDER — BUPIVACAINE HCL (PF) 0.25 % IJ SOLN
INTRAMUSCULAR | Status: AC
  Filled 2017-05-28: qty 30

## 2017-05-28 MED ORDER — ACETAMINOPHEN 10 MG/ML IV SOLN: 1000.0000 mg | Freq: Once | INTRAVENOUS | Status: DC | PRN

## 2017-05-28 MED ORDER — CHLORHEXIDINE GLUCONATE 4 % EX LIQD: 60.0000 mL | Freq: Once | CUTANEOUS | Status: DC

## 2017-05-28 MED ORDER — PHENYLEPHRINE 40 MCG/ML (10ML) SYRINGE FOR IV PUSH (FOR BLOOD PRESSURE SUPPORT)
PREFILLED_SYRINGE | INTRAVENOUS | Status: AC
  Filled 2017-05-28: qty 10

## 2017-05-28 SURGICAL SUPPLY — 53 items
BLADE GREAT WHITE 4.2 (BLADE) ×2 IMPLANT
BLADE GREAT WHITE 4.2MM (BLADE) ×1
BNDG COHESIVE 4X5 TAN STRL (GAUZE/BANDAGES/DRESSINGS) ×3 IMPLANT
BUR OVAL 4.0 (BURR) IMPLANT
BUR OVAL 6.0 (BURR) ×3 IMPLANT
CANNULA ACUFLEX KIT 5X76 (CANNULA) ×3 IMPLANT
COVER SURGICAL LIGHT HANDLE (MISCELLANEOUS) ×3 IMPLANT
DRAPE IMP U-DRAPE 54X76 (DRAPES) ×3 IMPLANT
DRAPE SHOULDER BEACH CHAIR (DRAPES) ×3 IMPLANT
DRAPE STERI 35X30 U-POUCH (DRAPES) ×3 IMPLANT
DRSG AQUACEL AG ADV 3.5X 4 (GAUZE/BANDAGES/DRESSINGS) ×3 IMPLANT
DRSG AQUACEL AG ADV 3.5X 6 (GAUZE/BANDAGES/DRESSINGS) ×3 IMPLANT
DRSG EMULSION OIL 3X3 NADH (GAUZE/BANDAGES/DRESSINGS) ×3 IMPLANT
DRSG PAD ABDOMINAL 8X10 ST (GAUZE/BANDAGES/DRESSINGS) ×3 IMPLANT
DURAPREP 26ML APPLICATOR (WOUND CARE) ×9 IMPLANT
ELECT MENISCUS 165MM 90D (ELECTRODE) ×3 IMPLANT
ELECT REM PT RETURN 9FT ADLT (ELECTROSURGICAL) ×3
ELECTRODE REM PT RTRN 9FT ADLT (ELECTROSURGICAL) ×1 IMPLANT
GAUZE SPONGE 4X4 12PLY STRL (GAUZE/BANDAGES/DRESSINGS) ×3 IMPLANT
GAUZE SPONGE 4X4 16PLY XRAY LF (GAUZE/BANDAGES/DRESSINGS) ×3 IMPLANT
GLOVE BIOGEL PI IND STRL 7.0 (GLOVE) ×1 IMPLANT
GLOVE BIOGEL PI IND STRL 8 (GLOVE) ×1 IMPLANT
GLOVE BIOGEL PI IND STRL 8.5 (GLOVE) IMPLANT
GLOVE BIOGEL PI INDICATOR 7.0 (GLOVE) ×2
GLOVE BIOGEL PI INDICATOR 8 (GLOVE) ×2
GLOVE BIOGEL PI INDICATOR 8.5 (GLOVE)
GLOVE ECLIPSE 8.0 STRL XLNG CF (GLOVE) ×3 IMPLANT
GLOVE ECLIPSE 8.5 STRL (GLOVE) IMPLANT
GLOVE SURG SS PI 6.5 STRL IVOR (GLOVE) ×3 IMPLANT
GOWN STRL REUS W/ TWL LRG LVL3 (GOWN DISPOSABLE) ×2 IMPLANT
GOWN STRL REUS W/ TWL XL LVL3 (GOWN DISPOSABLE) ×1 IMPLANT
GOWN STRL REUS W/TWL LRG LVL3 (GOWN DISPOSABLE) ×4
GOWN STRL REUS W/TWL XL LVL3 (GOWN DISPOSABLE) ×2
KIT ROOM TURNOVER OR (KITS) ×3 IMPLANT
LOOP VESSEL MINI RED (MISCELLANEOUS) ×3 IMPLANT
MANIFOLD NEPTUNE II (INSTRUMENTS) ×3 IMPLANT
NEEDLE 22X1 1/2 (OR ONLY) (NEEDLE) ×3 IMPLANT
NEEDLE SPNL 18GX3.5 QUINCKE PK (NEEDLE) ×3 IMPLANT
NS IRRIG 1000ML POUR BTL (IV SOLUTION) ×3 IMPLANT
PACK SHOULDER (CUSTOM PROCEDURE TRAY) ×3 IMPLANT
PACK UNIVERSAL I (CUSTOM PROCEDURE TRAY) ×3 IMPLANT
PAD ARMBOARD 7.5X6 YLW CONV (MISCELLANEOUS) ×6 IMPLANT
SET ARTHROSCOPY TUBING (MISCELLANEOUS) ×2
SET ARTHROSCOPY TUBING LN (MISCELLANEOUS) ×1 IMPLANT
SLING ARM IMMOBILIZER XL (CAST SUPPLIES) ×3 IMPLANT
STOCKINETTE IMPERVIOUS LG (DRAPES) ×3 IMPLANT
SUT ETHILON 4 0 PS 2 18 (SUTURE) ×3 IMPLANT
SYR 20ML ECCENTRIC (SYRINGE) ×3 IMPLANT
SYR CONTROL 10ML LL (SYRINGE) ×3 IMPLANT
TOWEL OR 17X24 6PK STRL BLUE (TOWEL DISPOSABLE) ×3 IMPLANT
TOWEL OR 17X26 10 PK STRL BLUE (TOWEL DISPOSABLE) ×3 IMPLANT
WAND HAND CNTRL MULTIVAC 90 (MISCELLANEOUS) ×3 IMPLANT
WATER STERILE IRR 1000ML POUR (IV SOLUTION) ×3 IMPLANT

## 2017-05-28 NOTE — Anesthesia Procedure Notes (Addendum)
Anesthesia Regional Block: Interscalene brachial plexus block   Pre-Anesthetic Checklist: ,, timeout performed, Correct Patient, Correct Site, Correct Laterality, Correct Procedure, Correct Position, site marked, Risks and benefits discussed,  Surgical consent,  Pre-op evaluation,  At surgeon's request and post-op pain management  Laterality: Right and Upper  Prep: chloraprep       Needles:  Injection technique: Single-shot  Needle Type: Echogenic Stimulator Needle     Needle Length: 9cm  Needle Gauge: 21   Needle insertion depth: 2 cm   Additional Needles:   Procedures:,,,, ultrasound used (permanent image in chart),,,,  Motor weakness within 5 minutes.  Narrative:  Start time: 05/28/2017 12:30 PM End time: 05/28/2017 12:40 PM Injection made incrementally with aspirations every 5 mL.  Performed by: Personally  Anesthesiologist: Leilani AbleHatchett, Camilah Spillman, MD

## 2017-05-28 NOTE — Progress Notes (Signed)
The recent History & Physical has been reviewed. I have personally examined the patient today. There is no interval change to the documented History & Physical. The patient would like to proceed with the procedure.  [name] [name] 05/28/2017,  12:48 PM  Patient ID: Jason Love, male   DOB: 02/04/1976, [age] y.o.   MRN: [id number]

## 2017-05-28 NOTE — Anesthesia Postprocedure Evaluation (Signed)
Anesthesia Post Note  Patient: Lucila MaineRichard B Carapia  Procedure(s) Performed: RIGHT SHOULDER ARTHROSCOPY, SUBACROMIAL DECOMPRESSION, DISTAL CLAVICLE RESECTION, POSSIBLE MINI OPEN ROTATOR CUFF REPAIR, DERMASPAN PATCH (Right ) ULNAR NERVE DECOMPRESSION AT RIGHT ELBOW (Right )     Patient location during evaluation: PACU Anesthesia Type: General Level of consciousness: awake, awake and alert and oriented Pain management: pain level controlled Vital Signs Assessment: post-procedure vital signs reviewed and stable Respiratory status: spontaneous breathing, nonlabored ventilation and respiratory function stable Cardiovascular status: blood pressure returned to baseline Postop Assessment: no headache Anesthetic complications: no    Last Vitals:  Vitals:   05/28/17 1609 05/28/17 1620  BP: 121/79 115/83  Pulse: 71 69  Resp: 18 18  Temp: 36.4 C   SpO2: 97% 97%    Last Pain:  Vitals:   05/28/17 1136  TempSrc:   PainSc: 3                  Veronia Laprise COKER

## 2017-05-28 NOTE — Transfer of Care (Signed)
Immediate Anesthesia Transfer of Care Note  Patient: Jason Love  Procedure(s) Performed: RIGHT SHOULDER ARTHROSCOPY, SUBACROMIAL DECOMPRESSION, DISTAL CLAVICLE RESECTION, POSSIBLE MINI OPEN ROTATOR CUFF REPAIR, DERMASPAN PATCH (Right ) ULNAR NERVE DECOMPRESSION AT RIGHT ELBOW (Right )  Patient Location: PACU  Anesthesia Type:GA combined with regional for post-op pain  Level of Consciousness: awake, alert  and oriented  Airway & Oxygen Therapy: Patient Spontanous Breathing and Patient connected to face mask oxygen  Post-op Assessment: Report given to RN and Post -op Vital signs reviewed and stable  Post vital signs: Reviewed and stable  Last Vitals:  Vitals:   05/28/17 1225 05/28/17 1230  BP: 124/78 134/73  Pulse: 89 88  Resp: (!) 21 17  Temp:    SpO2: 97% 97%    Last Pain:  Vitals:   05/28/17 1136  TempSrc:   PainSc: 3          Complications: No apparent anesthesia complications

## 2017-05-28 NOTE — Progress Notes (Signed)
PATIENT ID:      Jason MaineRichard B Love  MRN:     [id number] DOB/AGE:    12/03/1975 / 42 y.o.       OPERATIVE REPORT    DATE OF PROCEDURE:  05/28/2017       PREOPERATIVE DIAGNOSIS:   Impingement Syndrome,partial Rotator Cuff Tear, Acromioclavicular Joint Arthritis and Cubital Tunnel Syndrome right upper extremity                                                       Estimated body mass index is 26.58 kg/m as calculated from the following:   Height as of 05/22/17: 6' (1.829 m).   Weight as of this encounter: 196 lb (88.9 kg).     POSTOPERATIVE DIAGNOSIS:   IMPINGEMENT SYNDROME, acromial clavicular joint DJD right shoulder, cubital tunnel syndrome right elbow                                                                     Estimated body mass index is 26.58 kg/m as calculated from the following:   Height as of 05/22/17: 6' (1.829 m).   Weight as of this encounter: 196 lb (88.9 kg).     PROCEDURE:  Procedure(s): RIGHT SHOULDER ARTHROSCOPY, SUBACROMIAL DECOMPRESSION, DISTAL CLAVICLE RESECTION,  ULNAR NERVE DECOMPRESSION AT RIGHT ELBOW      SURGEON:  Norlene CampbellPeter Sanye Ledesma, MD    ASSISTANT:   Jacqualine CodeBrian Petrarca, PA-C   (Present and scrubbed throughout the case, critical for assistance with exposure, retraction, instrumentation, and closure.)          ANESTHESIA: regional and general     DRAINS: none :      TOURNIQUET TIME:  Total Tourniquet Time Documented: area (laterality) - 22 minutes Total: area (laterality) - 22 minutes     COMPLICATIONS:  None   CONDITION:  stable  PROCEDURE IN DETAIL: [id number]   [name] 05/28/2017, 2:56 PM  Patient ID: Jason Maineichard B Love, male   DOB: 08/12/1975, 42 y.o.   MRN: [id number]

## 2017-05-28 NOTE — Anesthesia Procedure Notes (Signed)
Procedure Name: Intubation Date/Time: 05/28/2017 1:13 PM Performed by: Pearson Grippeobertson, Cindie Rajagopalan M, CRNA Pre-anesthesia Checklist: Patient identified, Emergency Drugs available, Suction available and Patient being monitored Patient Re-evaluated:Patient Re-evaluated prior to induction Oxygen Delivery Method: Circle system utilized Preoxygenation: Pre-oxygenation with 100% oxygen Induction Type: IV induction Ventilation: Mask ventilation without difficulty Laryngoscope Size: Miller and 2 Grade View: Grade I Tube type: Oral Tube size: 7.5 mm Number of attempts: 1 Airway Equipment and Method: Stylet and Oral airway Placement Confirmation: ETT inserted through vocal cords under direct vision,  positive ETCO2 and breath sounds checked- equal and bilateral Secured at: 22 cm Tube secured with: Tape Dental Injury: Teeth and Oropharynx as per pre-operative assessment

## 2017-05-28 NOTE — Anesthesia Preprocedure Evaluation (Signed)
Anesthesia Evaluation  Patient identified by MRN, date of birth, ID band Patient awake    Reviewed: Allergy & Precautions, NPO status , Patient's Chart, lab work & pertinent test results  Airway Mallampati: I       Dental no notable dental hx. (+) Teeth Intact   Pulmonary former smoker,    Pulmonary exam normal breath sounds clear to auscultation       Cardiovascular hypertension, Normal cardiovascular exam Rhythm:Regular Rate:Normal     Neuro/Psych negative psych ROS   GI/Hepatic Neg liver ROS, GERD  Medicated and Controlled,  Endo/Other  negative endocrine ROS  Renal/GU negative Renal ROS  negative genitourinary   Musculoskeletal  (+) Arthritis , Osteoarthritis,    Abdominal Normal abdominal exam  (+)   Peds  Hematology negative hematology ROS (+)   Anesthesia Other Findings   Reproductive/Obstetrics                             Anesthesia Physical Anesthesia Plan  ASA: II  Anesthesia Plan: General   Post-op Pain Management:  Regional for Post-op pain   Induction: Intravenous  PONV Risk Score and Plan: 2 and Ondansetron and Dexamethasone  Airway Management Planned: Oral ETT  Additional Equipment:   Intra-op Plan:   Post-operative Plan: Extubation in OR  Informed Consent: I have reviewed the patients History and Physical, chart, labs and discussed the procedure including the risks, benefits and alternatives for the proposed anesthesia with the patient or authorized representative who has indicated his/her understanding and acceptance.   Dental advisory given  Plan Discussed with: CRNA and Surgeon  Anesthesia Plan Comments:         Anesthesia Quick Evaluation

## 2017-05-29 ENCOUNTER — Encounter (HOSPITAL_COMMUNITY): Payer: Self-pay | Admitting: Orthopaedic Surgery

## 2017-05-29 NOTE — Op Note (Signed)
NAME:  Jason Love, Jason Love                    ACCOUNT NO.:  MEDICAL RECORD NO.:  [id number]  LOCATION:                                 FACILITY:  PHYSICIAN:  Vonna Kotyk. Durward Fortes, M.D.    DATE OF BIRTH:  DATE OF PROCEDURE:  05/28/2017 DATE OF DISCHARGE:                              OPERATIVE REPORT   PREOPERATIVE DIAGNOSES: 1. Impingement syndrome, degenerative joint disease acromioclavicular     joint, and partial rotator cuff tear, right shoulder. 2. Cubital tunnel syndrome, right elbow.  POSTOPERATIVE DIAGNOSES: 1. Impingement syndrome, right shoulder with degenerative arthrosis,     acromioclavicular joint. 2. Cubital tunnel syndrome, right elbow.  PROCEDURE: 1. Diagnostic arthroscopy right shoulder with arthroscopic subacromial     decompression and distal clavicle resection. 2. Decompression, ulnar nerve, right elbow.  SURGEON:  Vonna Kotyk. Durward Fortes, M.D.  ASSISTANT:  Aaron Edelman D. Petrarca, P.A.-C.  ANESTHESIA:  General with supplemental interscalene nerve block.  COMPLICATIONS:  None.  DESCRIPTION OF PROCEDURE:  Mr. Vacha was met with family member in the holding area, identified the right upper extremity i.e., elbow and shoulder as the appropriate operative site and marked it accordingly. Anesthesia performed interscalene nerve block.  The patient was then transported to room #7 and placed under general anesthesia without difficulty.  He was then placed in the semi-sitting position with a shoulder frame.  Examination of the right shoulder under anesthesia revealed no evidence of instability or adhesive capsulitis.  The entire right upper extremity was then prepped with chlorhexidine scrub and DuraPrep x2 from the base of the neck circumferential to the tips of the fingers.  Sterile draping was performed.  Time-out was called.  Marking pen was used to outline the [facility] joint, the coracoid, the acromion, the right shoulder.  A point of fingerbreadth posterior and medial to  the posterior angle acromion, a small stab wound was made and the arthroscope easily placed into the shoulder joint.  There was no effusion or hemarthrosis.  There was no evidence of loose material.  The biceps tendon appeared to be intact as did the subscapularis tendon.  There was no appreciable chondromalacia of the humeral head or the acromion.  Labrum was intact. I did not see significant tearing of the rotator cuff.  There was 1 small area of excoriation at the supra and infraspinatus junction that was probably a millimeter or 2 in thickness.  This was left alone. There was some abundant posterior capsular tissue, which I simply debrided with the low-voltage ArthroCare wand.  With further evaluation, I did not see any evidence of further pathology.  The arthroscope was then placed in subacromial space posteriorly, the cannula in the subacromial space anteriorly, and a third portal established in lateral subacromial space.  An arthroscopic subacromial decompression was performed.  There was abundant bursal tissue, some of which was inflammatory.  There was obvious anterior overhang and lateral overhang of the acromion.  Using a 6 mm bur, performed an acromioplasty. I released the acromioclavicular, the CA ligament.  There were some degenerative changes about the acromioclavicular joint with inflammatory synovitis.  Distal clavicle resection was performed with the same 6 mm bur with  a nice flat resection.  I did not see any evidence of tearing on the bursal surface of the cuff.  The wounds were then irrigated with saline solution.  The anterior and lateral portals were closed with single 4-0 Ethilon sutures.  The patient was then placed in a Morse supine position.  The dressings were removed.  The drapes were removed from the right upper extremity and we reprepped and redraped.  We applied a sterile tourniquet with the arm elevated, was Esmarch exsanguinated with a proximal  tourniquet at 260 mmHg.  Skin incision was outlined along the ulnar nerve between the medial epicondyle and the olecranon.  Via sharp dissection, the incision was carried down to subcutaneous tissue and by sharp dissection, superficial fascia was released.  Using careful technique with scissors, I then released soft tissue down to the ulnar nerve tunnel.  There were several small neurovascular bundles that I left in place and simply elevated these with blunt retractors.  I was able to visualize the nerve more proximally and then with care taken to protect the nerve, released it from its proximal to its distal extent.  I released it as far distally as its first motor branch.  The nerve was thin directly behind the medial epicondyle.  It was not unstable.  It was quite irritable at that level as well by simply touching at the fingers would not move.  I thought he had a very nice decompression of the nerve.  The nerve appeared to be intact.  I think the area of significant compression was directly behind the medial epicondyle.  The wound was then irrigated with saline solution and closed the subcu with interrupted 3-0 Monocryl. Skin was closed with Steri-Strips over benzoin.  We did release the tourniquet with immediate capillary refill to the skin edges.  Any bleeders were coagulated with the bipolar.  Sterile bulky dressing was applied.  We did place the elbow, distal arm and forearm in a posterior splint and then with an Ace bandage, we then applied sterile bulky dressing to the shoulder.  The patient tolerated the procedure without complications.  He was then woken, placed on the operating stretcher and returned to the PACU without problem.  PLAN:  Oxycodone for pain, office first of the week.     Vonna Kotyk. Durward Fortes, M.D.     PWW/MEDQ  D:  05/28/2017  T:  05/28/2017  Job:  [id number]

## 2017-06-03 ENCOUNTER — Encounter (INDEPENDENT_AMBULATORY_CARE_PROVIDER_SITE_OTHER): Payer: Self-pay | Admitting: Orthopaedic Surgery

## 2017-06-03 ENCOUNTER — Ambulatory Visit (INDEPENDENT_AMBULATORY_CARE_PROVIDER_SITE_OTHER): Payer: BC Managed Care – PPO | Admitting: Orthopaedic Surgery

## 2017-06-03 DIAGNOSIS — M25511 Pain in right shoulder: Secondary | ICD-10-CM

## 2017-06-03 DIAGNOSIS — G8929 Other chronic pain: Secondary | ICD-10-CM

## 2017-06-03 NOTE — Progress Notes (Signed)
Office Visit Note   Patient: Jason Love           Date of Birth: 10/25/1975           MRN: [id number] Visit Date: 06/03/2017              Requested by: Kandyce RudBabaoff, Marcus, MD [address] [facility] [place] [facility] [place], [place] [id number] PCP: Kandyce RudBabaoff, Marcus, MD   Assessment & Plan: Visit Diagnoses:  1. Chronic right shoulder pain     Plan: 6 days status post arthroscopic subacromial decompression and distal clavicle resection. I also performed a decompression of the ulnar nerve along the medial aspect of the right elbow. Doing well. Has not used any pain medicines only Advil. Splint was removed. Instructed on range of motion excises seen in the office in 7-10 days and begin formal physical therapy Follow-Up Instructions: Return in about 1 week (around 06/10/2017).   Orders:  No orders of the defined types were placed in this encounter.  No orders of the defined types were placed in this encounter.     Procedures: No procedures performed   Clinical Data: No additional findings.   Subjective: Chief Complaint  Patient presents with  . Right Shoulder - Routine Post Op    Mr.Jason Constant. Love is a 42 y o S/P 6 days   Combined surgery 6 days ago for decompression of ulnar nerve medial aspect right elbow without transposition. Also performed an arthroscopic SCD DCR right shoulder very small partial rotator cuff tear. Doing well without problem.  HPI  Review of Systems  Constitutional: Negative for fatigue.  HENT: Negative for hearing loss.   Respiratory: Negative for apnea, chest tightness and shortness of breath.   Cardiovascular: Negative for chest pain, palpitations and leg swelling.  Gastrointestinal: Negative for blood in stool, constipation and diarrhea.  Genitourinary: Negative for difficulty urinating.  Musculoskeletal: Negative for arthralgias, back pain, joint swelling, myalgias, neck pain and neck stiffness.  Neurological: Negative for  weakness, numbness and headaches.  Hematological: Does not bruise/bleed easily.  Psychiatric/Behavioral: Negative for sleep disturbance. The patient is not nervous/anxious.      Objective: Vital Signs: There were no vitals taken for this visit.  Physical Exam  Ortho Exam awake alert and oriented 3. Sitting long arm splint, bulky dressing and sling removed from right upper extremity. All wounds of healed nicely. Steri-Strips in place along the medial aspect of the right elbow. 2 stitches were removed from the right shoulder. Neurovascular exam intact.  Specialty Comments:  No specialty comments available.  Imaging: No results found.   PMFS History: Patient Active Problem List   Diagnosis Date Noted  . Impingement syndrome of right shoulder 05/28/2017  . Chronic right shoulder pain 04/05/2017  . Cubital tunnel syndrome on right 01/24/2017  . Paresthesias in right hand 01/02/2017  . Shoulder pain 08/22/2016  . Pure hypercholesterolemia 03/06/2016  . Strain of rotator cuff capsule 01/30/2016  . Lateral epicondylitis 01/30/2016  . Blood pressure elevated 02/11/2015  . GERD without esophagitis 02/11/2015   Past Medical History:  Diagnosis Date  . GERD (gastroesophageal reflux disease)    mild-no meds used    History reviewed. No pertinent family history.  Past Surgical History:  Procedure Laterality Date  . KNEE ARTHROSCOPY  1995  . MOUTH SURGERY  1999  . SHOULDER ARTHROSCOPY Left 2013  . SHOULDER ARTHROSCOPY Right 05/28/2017   Procedure: RIGHT SHOULDER ARTHROSCOPY, SUBACROMIAL DECOMPRESSION, DISTAL CLAVICLE RESECTION, POSSIBLE MINI OPEN  ROTATOR CUFF REPAIR, [facility] PATCH;  Surgeon: Valeria Batman, MD;  Location: [facility] OR;  Service: Orthopedics;  Laterality: Right;  . ULNAR NERVE TRANSPOSITION Right 05/28/2017   Procedure: ULNAR NERVE DECOMPRESSION AT RIGHT ELBOW;  Surgeon: Valeria Batman, MD;  Location: [facility] OR;  Service: Orthopedics;  Laterality: Right;   Social  History   Occupational History  . Not on file  Tobacco Use  . Smoking status: Former Smoker    Last attempt to quit: 1999    Years since quitting: 20.0  . Smokeless tobacco: Never Used  Substance and Sexual Activity  . Alcohol use: Not on file    Comment: rare  . Drug use: No  . Sexual activity: Yes

## 2017-06-17 ENCOUNTER — Encounter (INDEPENDENT_AMBULATORY_CARE_PROVIDER_SITE_OTHER): Payer: Self-pay | Admitting: Orthopaedic Surgery

## 2017-06-17 ENCOUNTER — Ambulatory Visit (INDEPENDENT_AMBULATORY_CARE_PROVIDER_SITE_OTHER): Payer: BC Managed Care – PPO | Admitting: Orthopaedic Surgery

## 2017-06-17 VITALS — BP 136/76 | HR 81 | Resp 10 | Ht 70.0 in | Wt 200.0 lb

## 2017-06-17 DIAGNOSIS — Z9889 Other specified postprocedural states: Secondary | ICD-10-CM

## 2017-06-17 NOTE — Progress Notes (Signed)
Office Visit Note   Patient: Jason Love           Date of Birth: 05/15/1975           MRN: [id number] Visit Date: 06/17/2017              Requested by: Kandyce Rud, MD [address] [facility] - [facility] and Internal Medicine [place], [place] [address] PCP: Kandyce Rud, MD   Assessment & Plan: Visit Diagnoses:  1. Status post arthroscopy of right shoulder     Plan: 3 weeks status post right shoulder arthroscopic subacromial decompression and distal clavicle resection. I also performed a decompression of the ulnar nerve along the medial aspect of the right elbow. Feeling better in terms of nerve pain. Still having a fair amount of stiffness about the right shoulder. We need to start physical therapy. No work .office 3 weeks.  Follow-Up Instructions: Return in about 3 weeks (around 07/08/2017).   Orders:  Orders Placed This Encounter  Procedures  . Ambulatory referral to Physical Therapy   No orders of the defined types were placed in this encounter.     Procedures: No procedures performed   Clinical Data: No additional findings.   Subjective: Chief Complaint  Patient presents with  . Right Shoulder - Routine Post Op, Pain    Jason Love is a 42 y o S/P 2 weeks Right shoulder arthroscopy w/ subacromial decompression.  He relates he went on a road trip to [place] [facility], rode in the car and walked a lot, shoulder is very irritated. Took advil for pain still wears sling in public.  Injury occurred about a year and a half ago. Has not worked at all in about 6 months. 3 weeks status post right shoulder surgery and decompression of the ulnar nerve will aspect of right elbow. Right shoulder is stiff. Still having some tenderness in the area of the acromioclavicular joint. Feels that the decompression the nerve has "helped".  HPI  Review of Systems   Objective: Vital Signs: BP 136/76   Pulse 81   Resp 10   Ht 5\' 10"  (1.778 m)   Wt 200 lb (90.7 kg)   BMI 28.70 kg/m    Physical Exam  Ortho Exam awake alert and oriented 3. Comfortable sitting. Arthroscopic portals of healed right shoulder. Incision about the ulnar nerve medial aspect right elbow is healed as well. Normal neurovascular exam, right hand. Quite stiff in terms of right shoulder motion with only about 70 of abduction and 90 of flexion. Does have some tenderness over the acromioclavicular joint  Specialty Comments:  No specialty comments available.  Imaging: No results found.   PMFS History: Patient Active Problem List   Diagnosis Date Noted  . Impingement syndrome of right shoulder 05/28/2017  . Chronic right shoulder pain 04/05/2017  . Cubital tunnel syndrome on right 01/24/2017  . Paresthesias in right hand 01/02/2017  . Shoulder pain 08/22/2016  . Pure hypercholesterolemia 03/06/2016  . Strain of rotator cuff capsule 01/30/2016  . Lateral epicondylitis 01/30/2016  . Blood pressure elevated 02/11/2015  . GERD without esophagitis 02/11/2015   Past Medical History:  Diagnosis Date  . GERD (gastroesophageal reflux disease)    mild-no meds used    History reviewed. No pertinent family history.  Past Surgical History:  Procedure Laterality Date  . KNEE ARTHROSCOPY  1995  . MOUTH SURGERY  1999  . SHOULDER ARTHROSCOPY Left 2013  . SHOULDER ARTHROSCOPY Right 05/28/2017   Procedure: RIGHT  SHOULDER ARTHROSCOPY, SUBACROMIAL DECOMPRESSION, DISTAL CLAVICLE RESECTION, POSSIBLE MINI OPEN ROTATOR CUFF REPAIR, [facility];  Surgeon: Valeria BatmanWhitfield, Mazel Villela W, MD;  Location: [facility] OR;  Service: Orthopedics;  Laterality: Right;  . ULNAR NERVE TRANSPOSITION Right 05/28/2017   Procedure: ULNAR NERVE DECOMPRESSION AT RIGHT ELBOW;  Surgeon: Valeria BatmanWhitfield, Jeryl Wilbourn W, MD;  [facility];  Service: Orthopedics;  Laterality: Right;   Social History   Occupational History  . Not on file  Tobacco Use  . Smoking status: Former Smoker    Last attempt to quit: 1999    Years since quitting: 20.1  .  Smokeless tobacco: Never Used  Substance and Sexual Activity  . Alcohol use: Not on file    Comment: rare  . Drug use: No  . Sexual activity: Yes     Valeria BatmanPeter W Kacy Conely, MD   Note - This record has been created using [employer] software.  Chart creation errors have been sought, but may not always  have been located. Such creation errors do not reflect on  the standard of medical care.

## 2017-07-01 ENCOUNTER — Ambulatory Visit: Payer: BC Managed Care – PPO | Attending: Orthopaedic Surgery | Admitting: Physical Therapy

## 2017-07-01 ENCOUNTER — Encounter: Payer: Self-pay | Admitting: Physical Therapy

## 2017-07-01 ENCOUNTER — Other Ambulatory Visit: Payer: Self-pay

## 2017-07-01 DIAGNOSIS — M25521 Pain in right elbow: Secondary | ICD-10-CM

## 2017-07-01 DIAGNOSIS — M25611 Stiffness of right shoulder, not elsewhere classified: Secondary | ICD-10-CM | POA: Diagnosis present

## 2017-07-01 DIAGNOSIS — M25511 Pain in right shoulder: Secondary | ICD-10-CM | POA: Insufficient documentation

## 2017-07-01 DIAGNOSIS — M6281 Muscle weakness (generalized): Secondary | ICD-10-CM | POA: Diagnosis present

## 2017-07-01 DIAGNOSIS — M25621 Stiffness of right elbow, not elsewhere classified: Secondary | ICD-10-CM | POA: Diagnosis present

## 2017-07-01 NOTE — Therapy (Signed)
[facility] [address] [place], [place], [address] Phone: [phone]   Fax:  [phone]  Physical Therapy Evaluation  Patient Details  Name: Jason Love MRN: [id number] Date of Birth: 1975/08/26 Referring Provider: Valeria Batman, MD   Encounter Date: 07/01/2017  PT End of Session - 07/01/17 1017    Visit Number  1    Number of Visits  21    Date for PT Re-Evaluation  09/13/17    Authorization Type  [employer]- no visit limit    PT Start Time  1014    PT Stop Time  1101    PT Time Calculation (min)  47 min    Activity Tolerance  Patient limited by pain    Behavior During Therapy  [facility] for tasks assessed/performed       Past Medical History:  Diagnosis Date  . GERD (gastroesophageal reflux disease)    mild-no meds used    Past Surgical History:  Procedure Laterality Date  . KNEE ARTHROSCOPY  1995  . MOUTH SURGERY  1999  . SHOULDER ARTHROSCOPY Left 2013  . SHOULDER ARTHROSCOPY Right 05/28/2017   Procedure: RIGHT SHOULDER ARTHROSCOPY, SUBACROMIAL DECOMPRESSION, DISTAL CLAVICLE RESECTION, POSSIBLE MINI OPEN ROTATOR CUFF REPAIR, [facility] PATCH;  Surgeon: Valeria Batman, MD;  Location: [facility] OR;  Service: Orthopedics;  Laterality: Right;  . ULNAR NERVE TRANSPOSITION Right 05/28/2017   Procedure: ULNAR NERVE DECOMPRESSION AT RIGHT ELBOW;  Surgeon: Valeria Batman, MD;  Location: [facility] OR;  Service: Orthopedics;  Laterality: Right;    There were no vitals filed for this visit.   Subjective Assessment - 07/01/17 1018    Subjective  Good and bad days since surgery. Wakes at night due to pain- not sleeping in sling. Wears sling if he is doing a lot of walking to help hold weight. 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 Still numb in elbow, occasionally has return of symptoms in Rt hand. Still trying to avoid use of Rt hand. Tried PT after injury without improvement and had surgery. Doing pendulums  as prescribed by MD    Patient Stated Goals  decrease pain, improve strength level, back to PLOF    Currently in Pain?  Yes    Pain Score  7     Pain Location  Shoulder    Pain Orientation  Right    Pain Descriptors / Indicators  Aching;Sharp;Sore;Stabbing;Squeezing    Pain Radiating Towards  some neck pain and occasional shooting pain down arm    Pain Onset  More than a month ago    Aggravating Factors   supporting weight    Pain Relieving Factors  ice, ibuprofen/tylenol         [facility] PT Assessment - 07/01/17 0001      Assessment   Medical Diagnosis  Rt GHJ SAD DCR ulnar nerve decompression    Referring Provider  Valeria Batman, MD    Onset Date/Surgical Date  05/28/17    Hand Dominance  Right    Next MD Visit  07/15/17    Prior Therapy  PT prior to surgery, not this year      Precautions   Precautions  None      Restrictions   Weight Bearing Restrictions  No      Balance Screen   Has the patient fallen in the past 6 months  No      Prior Function   Vocation Requirements  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[place], 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+, carrying paint &  sheet rock      Cognition   Overall Cognitive Status  Within Functional Limits for tasks assessed      Observation/Other Assessments   Focus on Therapeutic Outcomes (FOTO)   72% limited      Observation/Other Assessments-Edema    Edema  -- edema noted in Lt shoulder & elbow      Sensation   Additional Comments  hypersensitivity at medial elbow      ROM / Strength   AROM / PROM / Strength  AROM;PROM;Strength      AROM   AROM Assessment Site  Shoulder;Elbow    Right/Left Shoulder  Right    Right Shoulder Flexion  86 Degrees    Right Shoulder ABduction  54 Degrees    Right/Left Elbow  Right    Right Elbow Flexion  93    Right Elbow Extension  0      PROM   Overall PROM Comments  empty end feel with pain    PROM Assessment Site  Shoulder    Right/Left Shoulder  Right    Right Shoulder Flexion  75 Degrees    Right Shoulder ABduction  50 Degrees       Strength   Strength Assessment Site  Shoulder;Hand    Right/Left hand  Right;Left    Right Hand Grip (lbs)  65 45 50    Left Hand Grip (lbs)  102 95 97             Objective measurements completed on examination: See above findings.      [facility] Adult PT Treatment/Exercise - 07/01/17 0001      Exercises   Exercises  Shoulder;Elbow      Elbow Exercises   Elbow Flexion Limitations  self passive flexion    Other elbow exercises  desensitization      Shoulder Exercises: Seated   Other Seated Exercises  seated scapular retraction      Manual Therapy   Manual Therapy  Passive ROM    Passive ROM  GHJ & elbow to tolerance             PT Education - 07/01/17 1018    Education provided  Yes    Education Details  anatomy of condition, POC, HEP, exercise form/rationale, FOTO    Person(s) Educated  Patient    Methods  Explanation;Demonstration;Tactile cues;Verbal cues;Handout    Comprehension  Verbalized understanding;Need further instruction;Returned demonstration;Verbal cues required;Tactile cues required       PT Short Term Goals - 07/01/17 1305      PT SHORT TERM GOAL #1   Title  PROM GHJ  within 10 deg of Lt side, elbow flexion within 5 deg    Baseline  see flowsheet    Time  4    Period  Weeks    Status  New    Target Date  08/02/17      PT SHORT TERM GOAL #2   Title  Pt will be able to reach [facility] for dressing and reaching for small objects in high cabinets    Baseline  unable at eval    Time  4    Period  Weeks    Status  New    Target Date  08/02/17      PT SHORT TERM GOAL #3   Title  grip strength to 75% of Lt side    Baseline  50% at eval    Time  4    Period  Weeks  Status  New    Target Date  08/02/17        PT Long Term Goals - 07/01/17 1308      PT LONG TERM GOAL #1   Title  Pt will be able to use Rt arm to lift objects overhead to return to work-related activities    Baseline  unable at eval    Time  10    Period  Weeks     Status  New    Target Date  09/13/17      PT LONG TERM GOAL #2   Title  Pt will not be limited by sensory disturbances in hand/elbow    Baseline  pain when wearing long sleeve shirts, difficulty with utilizing grip strength     Time  10    Period  Weeks    Status  New    Target Date  09/13/17      PT LONG TERM GOAL #3   Title  Pt will be able to complete all househwork and ADLs with full use of dominant UE    Baseline  severe limitations at eval    Time  10    Period  Weeks    Status  New    Target Date  09/13/17      PT LONG TERM GOAL #4   Title  FOTO to 45% limited to indicate significant improvement in functional ability    Baseline  72% limited at eval    Time  10    Period  Weeks    Status  New    Target Date  09/13/17             Plan - 07/01/17 1259    Clinical Impression Statement  Pt presents to PT 1 mont post Rt GHJ SAD, DCR & ulnar nerve decompression. Pt with significant pain with active and passive motion and empty end feel. Significant limitation in elbow flexion and hypersensitivity at surgical site. Educated pt in desensitization techniques to perform at home. Pt will benefit from skilled PT in order to progress ROM and strength as tolerated to meet long term functional goals.     Clinical Presentation  Stable    Clinical Decision Making  Low    Rehab Potential  Good    PT Frequency  2x / week    PT Duration  Other (comment) 10 weeks    PT Treatment/Interventions  ADLs/Self Care Home Management;Cryotherapy;Electrical Stimulation;Ultrasound;Traction;Moist Heat;Iontophoresis 4mg /ml Dexamethasone;Therapeutic activities;Therapeutic exercise;Patient/family education;Neuromuscular re-education;Manual techniques;Passive range of motion;Taping;Dry needling    PT Next Visit Plan  continue to progress GHJ & elbow ROM as tolerated    PT Home Exercise Plan  passive elbow flexion, desensitization, scap retraction    Consulted and Agree with Plan of Care  Patient        Patient will benefit from skilled therapeutic intervention in order to improve the following deficits and impairments:  Pain, Increased muscle spasms, Decreased activity tolerance, Decreased range of motion, Decreased strength, Impaired UE functional use, Impaired flexibility  Visit Diagnosis: Acute pain of right shoulder - Plan: PT plan of care cert/re-cert  Pain in right elbow - Plan: PT plan of care cert/re-cert  Stiffness of right shoulder, not elsewhere classified - Plan: PT plan of care cert/re-cert  Stiffness of right elbow, not elsewhere classified - Plan: PT plan of care cert/re-cert  Muscle weakness (generalized) - Plan: PT plan of care cert/re-cert     Problem List Patient Active Problem List  Diagnosis Date Noted  . Impingement syndrome of right shoulder 05/28/2017  . Chronic right shoulder pain 04/05/2017  . Cubital tunnel syndrome on right 01/24/2017  . Paresthesias in right hand 01/02/2017  . Shoulder pain 08/22/2016  . Pure hypercholesterolemia 03/06/2016  . Strain of rotator cuff capsule 01/30/2016  . Lateral epicondylitis 01/30/2016  . Blood pressure elevated 02/11/2015  . GERD without esophagitis 02/11/2015   Jason Love PT, DPT 07/01/17 1:15 PM   [facility] [address] [place], [place], [address] Phone: [phone]   Fax:  [phone]  Name: Jason Love MRN: [id number] Date of Birth: 1975/10/19

## 2017-07-09 ENCOUNTER — Ambulatory Visit: Payer: BC Managed Care – PPO | Attending: Orthopaedic Surgery

## 2017-07-09 DIAGNOSIS — M25511 Pain in right shoulder: Secondary | ICD-10-CM | POA: Insufficient documentation

## 2017-07-09 DIAGNOSIS — M25621 Stiffness of right elbow, not elsewhere classified: Secondary | ICD-10-CM | POA: Diagnosis present

## 2017-07-09 DIAGNOSIS — M25611 Stiffness of right shoulder, not elsewhere classified: Secondary | ICD-10-CM | POA: Diagnosis present

## 2017-07-09 DIAGNOSIS — M25521 Pain in right elbow: Secondary | ICD-10-CM | POA: Insufficient documentation

## 2017-07-09 DIAGNOSIS — M6281 Muscle weakness (generalized): Secondary | ICD-10-CM | POA: Diagnosis present

## 2017-07-09 NOTE — Therapy (Signed)
[facility] [address] [place], [place], [address] Phone: [phone]   Fax:  [phone]  Physical Therapy Treatment  Patient Details  Name: Jason MaineRichard B Love MRN: [id number] Date of Birth: 02/20/1976 Referring Provider: Valeria BatmanWhitfield, Peter W, MD   Encounter Date: 07/09/2017  PT End of Session - 07/09/17 0852    Visit Number  2    Date for PT Re-Evaluation  09/13/17    Authorization Type  [employer]- no visit limit    PT Start Time  0852    PT Stop Time  0940    PT Time Calculation (min)  48 min    Activity Tolerance  Patient limited by pain    Behavior During Therapy  [facility] for tasks assessed/performed       Past Medical History:  Diagnosis Date  . GERD (gastroesophageal reflux disease)    mild-no meds used    Past Surgical History:  Procedure Laterality Date  . KNEE ARTHROSCOPY  1995  . MOUTH SURGERY  1999  . SHOULDER ARTHROSCOPY Left 2013  . SHOULDER ARTHROSCOPY Right 05/28/2017   Procedure: RIGHT SHOULDER ARTHROSCOPY, SUBACROMIAL DECOMPRESSION, DISTAL CLAVICLE RESECTION, POSSIBLE MINI OPEN ROTATOR CUFF REPAIR, [facility];  Surgeon: Valeria BatmanWhitfield, Peter W, MD;  Location: [facility];  Service: Orthopedics;  Laterality: Right;  . ULNAR NERVE TRANSPOSITION Right 05/28/2017   Procedure: ULNAR NERVE DECOMPRESSION AT RIGHT ELBOW;  Surgeon: Valeria BatmanWhitfield, Peter W, MD;  [facility];  Service: Orthopedics;  Laterality: Right;    There were no vitals filed for this visit.  Subjective Assessment - 07/09/17 0853    Subjective  Sore . worked on dendation rub scar.  Maybe more sore than last week.     Currently in Pain?  Yes    Pain Score  7     Pain Location  Shoulder    Pain Orientation  Right    Pain Descriptors / Indicators  Aching;Sharp    Pain Type  Acute pain    Pain Onset  More than a month ago    Pain Frequency  Constant    Aggravating Factors   using arm    Pain Relieving Factors  ice, meds     Multiple Pain Sites  No                       [facility] Adult PT Treatment/Exercise - 07/09/17 0001      Shoulder Exercises: Seated   Other Seated Exercises  seated scapular retraction x 5 reps with depression and elbow flex /ext and pronation/ supination x 5       Manual Therapy   Manual Therapy  Soft tissue mobilization    Soft tissue mobilization  pecs and posterior and anterior axilla and scapula  followed by gr 1-2 distraction and depression of GHJ     Passive ROM  GHJ & elbow to tolerance               PT Short Term Goals - 07/01/17 1305      PT SHORT TERM GOAL #1   Title  PROM GHJ  within 10 deg of Lt side, elbow flexion within 5 deg    Baseline  see flowsheet    Time  4    Period  Weeks    Status  New    Target Date  08/02/17      PT SHORT TERM GOAL #2   Title  Pt will be able to reach [facility] for  dressing and reaching for small objects in high cabinets    Baseline  unable at eval    Time  4    Period  Weeks    Status  New    Target Date  08/02/17      PT SHORT TERM GOAL #3   Title  grip strength to 75% of Lt side    Baseline  50% at eval    Time  4    Period  Weeks    Status  New    Target Date  08/02/17        PT Long Term Goals - 07/01/17 1308      PT LONG TERM GOAL #1   Title  Pt will be able to use Rt arm to lift objects overhead to return to work-related activities    Baseline  unable at eval    Time  10    Period  Weeks    Status  New    Target Date  09/13/17      PT LONG TERM GOAL #2   Title  Pt will not be limited by sensory disturbances in hand/elbow    Baseline  pain when wearing long sleeve shirts, difficulty with utilizing grip strength     Time  10    Period  Weeks    Status  New    Target Date  09/13/17      PT LONG TERM GOAL #3   Title  Pt will be able to complete all househwork and ADLs with full use of dominant UE    Baseline  severe limitations at eval    Time  10    Period  Weeks    Status  New    Target Date  09/13/17      PT LONG  TERM GOAL #4   Title  FOTO to 45% limited to indicate significant improvement in functional ability    Baseline  72% limited at eval    Time  10    Period  Weeks    Status  New    Target Date  09/13/17            Plan - 07/09/17 1610    Clinical Impression Statement  He was able to demo HEp . He is still very tender in sot tissue. Elbow ROM good but still painfull. He reports continues numbness in RT arm.    He declined modality post session    PT Treatment/Interventions  ADLs/Self Care Home Management;Cryotherapy;Electrical Stimulation;Ultrasound;Traction;Moist Heat;Iontophoresis 4mg /ml Dexamethasone;Therapeutic activities;Therapeutic exercise;Patient/family education;Neuromuscular re-education;Manual techniques;Passive range of motion;Taping;Dry needling    PT Next Visit Plan  continue to progress GHJ & elbow ROM as tolerated,  STW    PT Home Exercise Plan  passive elbow flexion, desensitization, scap retraction    Consulted and Agree with Plan of Care  Patient       Patient will benefit from skilled therapeutic intervention in order to improve the following deficits and impairments:  Pain, Increased muscle spasms, Decreased activity tolerance, Decreased range of motion, Decreased strength, Impaired UE functional use, Impaired flexibility  Visit Diagnosis: Acute pain of right shoulder  Pain in right elbow  Stiffness of right shoulder, not elsewhere classified  Stiffness of right elbow, not elsewhere classified  Muscle weakness (generalized)     Problem List Patient Active Problem List   Diagnosis Date Noted  . Impingement syndrome of right shoulder 05/28/2017  . Chronic right shoulder pain 04/05/2017  . Cubital tunnel syndrome  on right 01/24/2017  . Paresthesias in right hand 01/02/2017  . Shoulder pain 08/22/2016  . Pure hypercholesterolemia 03/06/2016  . Strain of rotator cuff capsule 01/30/2016  . Lateral epicondylitis 01/30/2016  . Blood pressure elevated  02/11/2015  . GERD without esophagitis 02/11/2015    Caprice Red  PT 07/09/2017, 9:26 AM  [facility] [address] [place], [place], [address] Phone: [phone]   Fax:  [phone]  Name: Jason Love MRN: [id number] Date of Birth: [birth year]

## 2017-07-11 ENCOUNTER — Ambulatory Visit: Payer: BC Managed Care – PPO

## 2017-07-11 DIAGNOSIS — M6281 Muscle weakness (generalized): Secondary | ICD-10-CM

## 2017-07-11 DIAGNOSIS — M25511 Pain in right shoulder: Secondary | ICD-10-CM

## 2017-07-11 DIAGNOSIS — M25621 Stiffness of right elbow, not elsewhere classified: Secondary | ICD-10-CM

## 2017-07-11 DIAGNOSIS — M25521 Pain in right elbow: Secondary | ICD-10-CM

## 2017-07-11 DIAGNOSIS — M25611 Stiffness of right shoulder, not elsewhere classified: Secondary | ICD-10-CM

## 2017-07-11 NOTE — Therapy (Signed)
[place], [place], [address] Phone: [phone]   Fax:  [phone]  Physical Therapy Treatment  Patient Details  Name: Jason Love MRN: [id number] Date of Birth: 10-23-1975 Referring Provider: Garald Balding, MD   Encounter Date: 07/11/2017  PT End of Session - 07/11/17 0846    Visit Number  3    Number of Visits  21    Date for PT Re-Evaluation  09/13/17    Authorization Type  BCBS- no visit limit    PT Start Time  0846    PT Stop Time  0925    PT Time Calculation (min)  39 min    Activity Tolerance  Patient limited by pain    Behavior During Therapy  [facility] for tasks assessed/performed       Past Medical History:  Diagnosis Date  . GERD (gastroesophageal reflux disease)    mild-no meds used    Past Surgical History:  Procedure Laterality Date  . KNEE ARTHROSCOPY  1995  . MOUTH SURGERY  1999  . SHOULDER ARTHROSCOPY Left 2013  . SHOULDER ARTHROSCOPY Right 05/28/2017   Procedure: RIGHT SHOULDER ARTHROSCOPY, SUBACROMIAL DECOMPRESSION, DISTAL CLAVICLE RESECTION, POSSIBLE MINI OPEN ROTATOR CUFF REPAIR, [facility] PATCH;  Surgeon: Garald Balding, MD;  Location: [place];  Service: Orthopedics;  Laterality: Right;  . ULNAR NERVE TRANSPOSITION Right 05/28/2017   Procedure: ULNAR NERVE DECOMPRESSION AT RIGHT ELBOW;  Surgeon: Garald Balding, MD;  Location: [place];  Service: Orthopedics;  Laterality: Right;    There were no vitals filed for this visit.  Subjective Assessment - 07/11/17 0848    Subjective  Sore today . May have done more yesterday.   walked alot without sling.     Currently in Pain?  Yes    Pain Score  8     Pain Location  Shoulder    Pain Orientation  Right    Pain Descriptors / Indicators  Aching;Sharp    Pain Type  Acute pain    Pain Onset  More than a month ago    Pain Frequency  Constant    Multiple Pain Sites  No                      [facility] Adult PT  Treatment/Exercise - 07/11/17 0001      Self-Care   Self-Care  Other Self-Care Comments    Other Self-Care Comments   incr meds frequency , cie , rest arm , minimize activity with RT arm.       Manual Therapy   Soft tissue mobilization  pecs and posterior and anterior axilla, deltoids and scapula  followed by gr 1-2 distraction anand STW traps and neck . d depression of GHJ                PT Short Term Goals - 07/11/17 0922      PT SHORT TERM GOAL #1   Title  PROM GHJ  within 10 deg of Lt side, elbow flexion within 5 deg    Baseline  elbow ROM WNL , shoulder limited    Status  Partially Met      PT SHORT TERM GOAL #2   Title  Pt will be able to reach [facility] for dressing and reaching for small objects in high cabinets    Status  On-going      PT SHORT TERM GOAL #3   Title  grip strength to 75% of  Lt side    Status  Unable to assess        PT Long Term Goals - 07/01/17 1308      PT LONG TERM GOAL #1   Title  Pt will be able to use Rt arm to lift objects overhead to return to work-related activities    Baseline  unable at eval    Time  10    Period  Weeks    Status  New    Target Date  09/13/17      PT LONG TERM GOAL #2   Title  Pt will not be limited by sensory disturbances in hand/elbow    Baseline  pain when wearing long sleeve shirts, difficulty with utilizing grip strength     Time  10    Period  Weeks    Status  New    Target Date  09/13/17      PT LONG TERM GOAL #3   Title  Pt will be able to complete all househwork and ADLs with full use of dominant UE    Baseline  severe limitations at eval    Time  10    Period  Weeks    Status  New    Target Date  09/13/17      PT LONG TERM GOAL #4   Title  FOTO to 45% limited to indicate significant improvement in functional ability    Baseline  72% limited at eval    Time  10    Period  Weeks    Status  New    Target Date  09/13/17            Plan - 07/11/17 0846    Clinical Impression Statement   More pain today without certain reeason so did manual only mostly sTW and he felt looser with some decr pain.    Asked hin to rest arm and use sling and take meds more frequently and ice for the next day or two to see if he can ease off pain.     PT Treatment/Interventions  ADLs/Self Care Home Management;Cryotherapy;Electrical Stimulation;Ultrasound;Traction;Moist Heat;Iontophoresis 48m/ml Dexamethasone;Therapeutic activities;Therapeutic exercise;Patient/family education;Neuromuscular re-education;Manual techniques;Passive range of motion;Taping;Dry needling    PT Next Visit Plan  continue to progress GHJ & elbow ROM as tolerated,  STW    PT Home Exercise Plan  passive elbow flexion, desensitization, scap retraction    Consulted and Agree with Plan of Care  Patient       Patient will benefit from skilled therapeutic intervention in order to improve the following deficits and impairments:  Pain, Increased muscle spasms, Decreased activity tolerance, Decreased range of motion, Decreased strength, Impaired UE functional use, Impaired flexibility  Visit Diagnosis: Acute pain of right shoulder  Pain in right elbow  Stiffness of right shoulder, not elsewhere classified  Stiffness of right elbow, not elsewhere classified  Muscle weakness (generalized)     Problem List Patient Active Problem List   Diagnosis Date Noted  . Impingement syndrome of right shoulder 05/28/2017  . Chronic right shoulder pain 04/05/2017  . Cubital tunnel syndrome on right 01/24/2017  . Paresthesias in right hand 01/02/2017  . Shoulder pain 08/22/2016  . Pure hypercholesterolemia 03/06/2016  . Strain of rotator cuff capsule 01/30/2016  . Lateral epicondylitis 01/30/2016  . Blood pressure elevated 02/11/2015  . GERD without esophagitis 02/11/2015    CDarrel Hoover PT 07/11/2017, 9:25 AM  [place]  [address] Phone:  [phone]   Fax:  [phone]  Name: Jason Love MRN: [id number] Date of Birth: 06-23-1975

## 2017-07-15 ENCOUNTER — Encounter (INDEPENDENT_AMBULATORY_CARE_PROVIDER_SITE_OTHER): Payer: Self-pay | Admitting: Orthopaedic Surgery

## 2017-07-15 ENCOUNTER — Ambulatory Visit (INDEPENDENT_AMBULATORY_CARE_PROVIDER_SITE_OTHER): Payer: BC Managed Care – PPO | Admitting: Orthopaedic Surgery

## 2017-07-15 ENCOUNTER — Other Ambulatory Visit (INDEPENDENT_AMBULATORY_CARE_PROVIDER_SITE_OTHER): Payer: Self-pay | Admitting: Radiology

## 2017-07-15 VITALS — BP 136/77 | HR 66 | Resp 16 | Ht 71.0 in | Wt 195.0 lb

## 2017-07-15 DIAGNOSIS — M25511 Pain in right shoulder: Secondary | ICD-10-CM

## 2017-07-15 DIAGNOSIS — G8929 Other chronic pain: Secondary | ICD-10-CM

## 2017-07-15 DIAGNOSIS — M7541 Impingement syndrome of right shoulder: Secondary | ICD-10-CM

## 2017-07-15 DIAGNOSIS — R202 Paresthesia of skin: Secondary | ICD-10-CM

## 2017-07-15 MED ORDER — DICLOFENAC SODIUM 1 % TD GEL: 4.0000 g | Freq: Every day | TRANSDERMAL | Status: DC | PRN

## 2017-07-15 NOTE — Progress Notes (Signed)
note I'll just does tell me  Office Visit Note   Patient: Jason Love           Date of Birth: 1976-01-27           MRN: [id number] Visit Date: 07/15/2017              Requested by: Kandyce Rud, MD [address] [facility] [place], [place] [address] PCP: Kandyce Rud, MD   Assessment & Plan: Visit Diagnoses:  1. Chronic right shoulder pain   2. Impingement syndrome of right shoulder   3. Paresthesias in right hand     Plan: 6-7 weeks status post right shoulder arthroscopy and ulnar nerve decompression right elbow.better than preop. Still having someDiscomfort. Continue physical therapy. Office 1 month.no work  Follow-Up Instructions: Return in about 1 month (around 08/15/2017).   Orders:  No orders of the defined types were placed in this encounter.  No orders of the defined types were placed in this encounter.     Procedures: No procedures performed   Clinical Data: No additional findings.   Subjective: Chief Complaint  Patient presents with  . Right Shoulder - Follow-up    [name] IS 41 Y O M HERE FOR F/U STILL HAVING NUMBNESS AROUND THE ELBOW AND HAND GOES NUMB FROM TIME TO TIME.  6-7 weeks postop. Had an arthroscopic subacromial decompression. At the same surgery had decompression of the ulnar nerve at the right elbow. Better in both areas than preop. Some Occasionally will have some discomfort in his right forearm. Not having numbness or tingling into his hand  HPI  Review of Systems  Constitutional: Negative for fatigue and fever.  HENT: Negative for ear pain.   Eyes: Negative for pain.  Respiratory: Negative for cough and shortness of breath.   Cardiovascular: Negative for leg swelling.  Gastrointestinal: Negative for constipation, diarrhea and nausea.  Genitourinary: Negative for dysuria.  Musculoskeletal: Negative for back pain and neck pain.  Skin: Negative for rash.  Allergic/Immunologic: Negative  for food allergies.  Neurological: Positive for weakness and numbness. Negative for dizziness, light-headedness and headaches.  Hematological: Does not bruise/bleed easily.  Psychiatric/Behavioral: Positive for sleep disturbance.     Objective: Vital Signs: BP 136/77 (BP Location: Right Arm, Patient Position: Sitting, Cuff Size: Normal)   Pulse 66   Resp 16   Ht 5\' 11"  (1.803 m)   Wt 195 lb (88.5 kg)   BMI 27.20 kg/m   Physical Exam  Ortho Examawake alert and oriented 3. Comfortable  butCould not initiate over head movement right shoulder. Negative impingement.good grip and good release. Some tenderness over  Ulnar nerve incision medial aspect Normal neurologic exam to right hand.   Specialty Comments:  No specialty comments available.  Imaging: No results found.   PMFS History: Patient Active Problem List   Diagnosis Date Noted  . Impingement syndrome of right shoulder 05/28/2017  . Chronic right shoulder pain 04/05/2017  . Cubital tunnel syndrome on right 01/24/2017  . Paresthesias in right hand 01/02/2017  . Shoulder pain 08/22/2016  . Pure hypercholesterolemia 03/06/2016  . Strain of rotator cuff capsule 01/30/2016  . Lateral epicondylitis 01/30/2016  . Blood pressure elevated 02/11/2015  . GERD without esophagitis 02/11/2015   Past Medical History:  Diagnosis Date  . GERD (gastroesophageal reflux disease)    mild-no meds used    History reviewed. No pertinent family history.  Past Surgical History:  Procedure Laterality Date  .  KNEE ARTHROSCOPY  1995  . MOUTH SURGERY  1999  . SHOULDER ARTHROSCOPY Left 2013  . SHOULDER ARTHROSCOPY Right 05/28/2017   Procedure: RIGHT SHOULDER ARTHROSCOPY, SUBACROMIAL DECOMPRESSION, DISTAL CLAVICLE RESECTION, POSSIBLE MINI OPEN ROTATOR CUFF REPAIR, [facility];  Surgeon: Valeria BatmanWhitfield, Anddy Wingert W, MD;  [facility];  Service: Orthopedics;  Laterality: Right;  . ULNAR NERVE TRANSPOSITION Right 05/28/2017   Procedure: ULNAR NERVE  DECOMPRESSION AT RIGHT ELBOW;  Surgeon: Valeria BatmanWhitfield, Maveric Debono W, MD;  [facility];  Service: Orthopedics;  Laterality: Right;   Social History   Occupational History  . Not on file  Tobacco Use  . Smoking status: Former Smoker    Last attempt to quit: 1999    Years since quitting: 20.2  . Smokeless tobacco: Never Used  Substance and Sexual Activity  . Alcohol use: Not on file    Comment: rare  . Drug use: No  . Sexual activity: Yes

## 2017-07-16 ENCOUNTER — Ambulatory Visit: Payer: BC Managed Care – PPO | Admitting: Physical Therapy

## 2017-07-16 ENCOUNTER — Encounter: Payer: Self-pay | Admitting: Physical Therapy

## 2017-07-16 ENCOUNTER — Telehealth (INDEPENDENT_AMBULATORY_CARE_PROVIDER_SITE_OTHER): Payer: Self-pay | Admitting: Orthopaedic Surgery

## 2017-07-16 ENCOUNTER — Other Ambulatory Visit (INDEPENDENT_AMBULATORY_CARE_PROVIDER_SITE_OTHER): Payer: Self-pay | Admitting: Radiology

## 2017-07-16 DIAGNOSIS — M25511 Pain in right shoulder: Secondary | ICD-10-CM

## 2017-07-16 DIAGNOSIS — M25621 Stiffness of right elbow, not elsewhere classified: Secondary | ICD-10-CM

## 2017-07-16 DIAGNOSIS — M6281 Muscle weakness (generalized): Secondary | ICD-10-CM

## 2017-07-16 DIAGNOSIS — M25521 Pain in right elbow: Secondary | ICD-10-CM

## 2017-07-16 DIAGNOSIS — M25611 Stiffness of right shoulder, not elsewhere classified: Secondary | ICD-10-CM

## 2017-07-16 MED ORDER — DICLOFENAC SODIUM 1 % TD GEL: 4.0000 g | Freq: Four times a day (QID) | TRANSDERMAL | 6 refills | Status: DC

## 2017-07-16 NOTE — Therapy (Signed)
[place], [place], [address] Phone: [phone]   Fax:  [phone]  Physical Therapy Treatment  Patient Details  Name: Jason Love MRN: [id number] Date of Birth: Feb 24, 1976 Referring Provider: Garald Balding, MD   Encounter Date: 07/16/2017  PT End of Session - 07/16/17 1017    Visit Number  4    Number of Visits  21    Date for PT Re-Evaluation  09/13/17    PT Start Time  0846    PT Stop Time  0931    PT Time Calculation (min)  45 min    Activity Tolerance  Patient limited by pain    Behavior During Therapy  [facility] for tasks assessed/performed       Past Medical History:  Diagnosis Date  . GERD (gastroesophageal reflux disease)    mild-no meds used    Past Surgical History:  Procedure Laterality Date  . KNEE ARTHROSCOPY  1995  . MOUTH SURGERY  1999  . SHOULDER ARTHROSCOPY Left 2013  . SHOULDER ARTHROSCOPY Right 05/28/2017   Procedure: RIGHT SHOULDER ARTHROSCOPY, SUBACROMIAL DECOMPRESSION, DISTAL CLAVICLE RESECTION, POSSIBLE MINI OPEN ROTATOR CUFF REPAIR, [facility] PATCH;  Surgeon: Garald Balding, MD;  Location: [place];  Service: Orthopedics;  Laterality: Right;  . ULNAR NERVE TRANSPOSITION Right 05/28/2017   Procedure: ULNAR NERVE DECOMPRESSION AT RIGHT ELBOW;  Surgeon: Garald Balding, MD;  Location: [place];  Service: Orthopedics;  Laterality: Right;    There were no vitals filed for this visit.  Subjective Assessment - 07/16/17 0852    Subjective  "The shoulder hurts worse today, I saw the Md and I think he wasn't very happy with the progress"    Currently in Pain?  Yes    Pain Score  8     Pain Orientation  Right    Pain Descriptors / Indicators  Aching    Pain Type  Surgical pain    Pain Onset  More than a month ago                      [facility] Adult PT Treatment/Exercise - 07/16/17 1020      Modalities   Modalities  Electrical Stimulation      Electrical  Stimulation   Electrical Stimulation Location  R medial elbow around incision utilized during shoulder/ elbow  treament    Electrical Stimulation Goals  Pain desensitizatoion,      Manual Therapy   Manual Therapy  Passive ROM;Joint mobilization    Manual therapy comments  attempted manual desensitization but pt was unable to tolerate    Joint Mobilization  disctraction grade 3 oscillations with inferior / posterior mobs    Soft tissue mobilization  pecs and posterior and anterior axilla, deltoids and scapula  followed by gr 1-2 distraction anand STW traps and neck . d depression of GHJ  MTPR along R upper trap/ posterior deltoid    Passive ROM  GHJ flexion/ abduction with gentle distraction oscillations, and elbow flexion/ extension to tolerance             PT Education - 07/16/17 0920    Education provided  Yes    Education Details  Updated HEP for shoulder table slides    Person(s) Educated  Patient    Methods  Explanation;Verbal cues    Comprehension  Verbalized understanding;Verbal cues required       PT Short Term Goals - 07/11/17 9021  PT SHORT TERM GOAL #1   Title  PROM GHJ  within 10 deg of Lt side, elbow flexion within 5 deg    Baseline  elbow ROM WNL , shoulder limited    Status  Partially Met      PT SHORT TERM GOAL #2   Title  Pt will be able to reach [facility] for dressing and reaching for small objects in high cabinets    Status  On-going      PT SHORT TERM GOAL #3   Title  grip strength to 75% of Lt side    Status  Unable to assess        PT Long Term Goals - 07/01/17 1308      PT LONG TERM GOAL #1   Title  Pt will be able to use Rt arm to lift objects overhead to return to work-related activities    Baseline  unable at eval    Time  10    Period  Weeks    Status  New    Target Date  09/13/17      PT LONG TERM GOAL #2   Title  Pt will not be limited by sensory disturbances in hand/elbow    Baseline  pain when wearing long sleeve shirts, difficulty  with utilizing grip strength     Time  10    Period  Weeks    Status  New    Target Date  09/13/17      PT LONG TERM GOAL #3   Title  Pt will be able to complete all househwork and ADLs with full use of dominant UE    Baseline  severe limitations at eval    Time  10    Period  Weeks    Status  New    Target Date  09/13/17      PT LONG TERM GOAL #4   Title  FOTO to 45% limited to indicate significant improvement in functional ability    Baseline  72% limited at eval    Time  10    Period  Weeks    Status  New    Target Date  09/13/17            Plan - 07/16/17 1017    Clinical Impression Statement  pt reported seeing his MD and stated that they expected him to be farther along. He continues to demonstrate significant sensitivty around the incision at the elbow and pain rated at 8/10 in the shoudler. attempted desensitization techniques but halted manual due to severity of pain and utilized E-stim throughout treatment to provide relief. continued shoulder mobs and updated HEP for gentle shoulder PROM using table slides. end of session he reported continued pain inthe shoulder/ elbow.     PT Treatment/Interventions  ADLs/Self Care Home Management;Cryotherapy;Electrical Stimulation;Ultrasound;Traction;Moist Heat;Iontophoresis 42m/ml Dexamethasone;Therapeutic activities;Therapeutic exercise;Patient/family education;Neuromuscular re-education;Manual techniques;Passive range of motion;Taping;Dry needling    PT Next Visit Plan  continue to progress GHJ & elbow ROM as tolerated,  STW, How was E-stim for elbow?    PT Home Exercise Plan  passive elbow flexion, desensitization, scap retraction, PROM shoulder table slides    Consulted and Agree with Plan of Care  Patient       Patient will benefit from skilled therapeutic intervention in order to improve the following deficits and impairments:  Pain, Increased muscle spasms, Decreased activity tolerance, Decreased range of motion, Decreased  strength, Impaired UE functional use, Impaired flexibility  Visit Diagnosis: Acute pain  of right shoulder  Pain in right elbow  Stiffness of right shoulder, not elsewhere classified  Stiffness of right elbow, not elsewhere classified  Muscle weakness (generalized)     Problem List Patient Active Problem List   Diagnosis Date Noted  . Impingement syndrome of right shoulder 05/28/2017  . Chronic right shoulder pain 04/05/2017  . Cubital tunnel syndrome on right 01/24/2017  . Paresthesias in right hand 01/02/2017  . Shoulder pain 08/22/2016  . Pure hypercholesterolemia 03/06/2016  . Strain of rotator cuff capsule 01/30/2016  . Lateral epicondylitis 01/30/2016  . Blood pressure elevated 02/11/2015  . GERD without esophagitis 02/11/2015    Starr Lake PT, DPT, LAT, ATC  07/16/17  10:22 AM      [facility] [address] [place], [place], [address] Phone: [phone]   Fax:  [phone]  Name: Jason Love MRN: [id number] Date of Birth: 11-20-1975

## 2017-07-16 NOTE — Telephone Encounter (Signed)
[facility] AND NOTIFIED PT RX WAS READY FOR PICK UP AT PHARMACY

## 2017-07-16 NOTE — Telephone Encounter (Signed)
Patient called stating that the Voltaren prescription has not been received by his pharmacy.  Please advise patient.  CB#[phone].  Thank you.

## 2017-07-18 ENCOUNTER — Other Ambulatory Visit (INDEPENDENT_AMBULATORY_CARE_PROVIDER_SITE_OTHER): Payer: Self-pay | Admitting: Radiology

## 2017-07-18 ENCOUNTER — Telehealth (INDEPENDENT_AMBULATORY_CARE_PROVIDER_SITE_OTHER): Payer: Self-pay | Admitting: Orthopaedic Surgery

## 2017-07-18 ENCOUNTER — Ambulatory Visit: Payer: BC Managed Care – PPO | Admitting: Physical Therapy

## 2017-07-18 ENCOUNTER — Encounter: Payer: Self-pay | Admitting: Physical Therapy

## 2017-07-18 DIAGNOSIS — M25511 Pain in right shoulder: Secondary | ICD-10-CM

## 2017-07-18 DIAGNOSIS — M25521 Pain in right elbow: Secondary | ICD-10-CM

## 2017-07-18 DIAGNOSIS — M25611 Stiffness of right shoulder, not elsewhere classified: Secondary | ICD-10-CM

## 2017-07-18 DIAGNOSIS — M6281 Muscle weakness (generalized): Secondary | ICD-10-CM

## 2017-07-18 DIAGNOSIS — M25621 Stiffness of right elbow, not elsewhere classified: Secondary | ICD-10-CM

## 2017-07-18 MED ORDER — DICLOFENAC SODIUM 1 % TD GEL: 4.0000 g | Freq: Four times a day (QID) | TRANSDERMAL | 6 refills | Status: AC

## 2017-07-18 NOTE — Telephone Encounter (Signed)
Please get prior authorization. Thank you!

## 2017-07-18 NOTE — Telephone Encounter (Signed)
CALLED AND GOT AUTHORIZATION FOR MED. I ALSO CALLED PT TO LET THEM KNOW IT WAS READY FOR PICK UP.

## 2017-07-18 NOTE — Telephone Encounter (Signed)
Patient called stating that he went to [facility] in [place] to pick up his Voltaren Gel and was told by the pharmacist that a prior authorization is required for it to be processed through his insurance [employer]).

## 2017-07-18 NOTE — Therapy (Signed)
[place], [place], [address] Phone: [phone]   Fax:  [phone]  Physical Therapy Treatment  Patient Details  Name: Jason Love MRN: [id number] Date of Birth: May 08, 1975 Referring Provider: Garald Balding, MD   Encounter Date: 07/18/2017  PT End of Session - 07/18/17 0848    Visit Number  5    Number of Visits  21    Date for PT Re-Evaluation  09/13/17    Authorization Type  BC[employer]- no visit limit    PT Start Time  0846    PT Stop Time  0937    PT Time Calculation (min)  51 min    Activity Tolerance  Patient limited by pain    Behavior During Therapy  [facility] for tasks assessed/performed       Past Medical History:  Diagnosis Date  . GERD (gastroesophageal reflux disease)    mild-no meds used    Past Surgical History:  Procedure Laterality Date  . KNEE ARTHROSCOPY  1995  . MOUTH SURGERY  1999  . SHOULDER ARTHROSCOPY Left 2013  . SHOULDER ARTHROSCOPY Right 05/28/2017   Procedure: RIGHT SHOULDER ARTHROSCOPY, SUBACROMIAL DECOMPRESSION, DISTAL CLAVICLE RESECTION, POSSIBLE MINI OPEN ROTATOR CUFF REPAIR, [facility] PATCH;  Surgeon: Garald Balding, MD;  Location: [place];  Service: Orthopedics;  Laterality: Right;  . ULNAR NERVE TRANSPOSITION Right 05/28/2017   Procedure: ULNAR NERVE DECOMPRESSION AT RIGHT ELBOW;  Surgeon: Garald Balding, MD;  Location: [place];  Service: Orthopedics;  Laterality: Right;    There were no vitals filed for this visit.  Subjective Assessment - 07/18/17 0849    Subjective  MD not very happy with the way numbness has healed, still occasionally gets numbness along ulnar nerve to hand. Shoulder still hurts, almost like the blood is not flowing well. It is still swollen.     Patient Stated Goals  decrease pain, improve strength level, back to PLOF    Currently in Pain?  Yes    Pain Score  7     Pain Location  Shoulder    Pain Orientation  Right    Multiple Pain  Sites  Yes    Pain Score  8    Pain Location  Elbow    Pain Orientation  Right;Lateral    Pain Descriptors / Indicators  -- heart beat    Aggravating Factors   touching it                      [facility] Adult PT Treatment/Exercise - 07/18/17 0001      Shoulder Exercises: Seated   Other Seated Exercises  ulnar nerve glide via wrist flx/ext in "ok" to accomodate shoulder      Shoulder Exercises: Prone   Retraction  10 reps    Extension  10 reps retraction+extension      Shoulder Exercises: Standing   Flexion  Right;15 reps AAROM with wand      Shoulder Exercises: Pulleys   Flexion  2 minutes      Modalities   Modalities  Vasopneumatic      Vasopneumatic   Number Minutes Vasopneumatic   15 minutes    Vasopnuematic Location   Shoulder    Vasopneumatic Pressure  Low    Vasopneumatic Temperature   34      Manual Therapy   Manual Therapy  Passive ROM;Joint mobilization    Manual therapy comments  ulnar nerve glide in supine  Passive ROM  GHJ to tolerance               PT Short Term Goals - 07/11/17 1505      PT SHORT TERM GOAL #1   Title  PROM GHJ  within 10 deg of Lt side, elbow flexion within 5 deg    Baseline  elbow ROM WNL , shoulder limited    Status  Partially Met      PT SHORT TERM GOAL #2   Title  Pt will be able to reach [facility] for dressing and reaching for small objects in high cabinets    Status  On-going      PT SHORT TERM GOAL #3   Title  grip strength to 75% of Lt side    Status  Unable to assess        PT Long Term Goals - 07/01/17 1308      PT LONG TERM GOAL #1   Title  Pt will be able to use Rt arm to lift objects overhead to return to work-related activities    Baseline  unable at eval    Time  10    Period  Weeks    Status  New    Target Date  09/13/17      PT LONG TERM GOAL #2   Title  Pt will not be limited by sensory disturbances in hand/elbow    Baseline  pain when wearing long sleeve shirts, difficulty with  utilizing grip strength     Time  10    Period  Weeks    Status  New    Target Date  09/13/17      PT LONG TERM GOAL #3   Title  Pt will be able to complete all househwork and ADLs with full use of dominant UE    Baseline  severe limitations at eval    Time  10    Period  Weeks    Status  New    Target Date  09/13/17      PT LONG TERM GOAL #4   Title  FOTO to 45% limited to indicate significant improvement in functional ability    Baseline  72% limited at eval    Time  10    Period  Weeks    Status  New    Target Date  09/13/17            Plan - 07/18/17 6979    Clinical Impression Statement  Minimal tolerance for exercise and PROM due to pain in shoulder and elbow. Educated pt on performing repetitions to tolerance to keep pain to a tolerable level. Able to recreate N/T into fingers with modified ulnar nerve glide while using Lt arm to support Rt.     PT Treatment/Interventions  ADLs/Self Care Home Management;Cryotherapy;Electrical Stimulation;Ultrasound;Traction;Moist Heat;Iontophoresis 64m/ml Dexamethasone;Therapeutic activities;Therapeutic exercise;Patient/family education;Neuromuscular re-education;Manual techniques;Passive range of motion;Taping;Dry needling    PT Next Visit Plan  continue GHJ ROM as tolerated, progress strength as tol    PT Home Exercise Plan  passive elbow flexion, desensitization, scap retraction, PROM shoulder table slides; prone retraction, prone extension, flexion AAROM with wand standing    Consulted and Agree with Plan of Care  Patient       Patient will benefit from skilled therapeutic intervention in order to improve the following deficits and impairments:  Pain, Increased muscle spasms, Decreased activity tolerance, Decreased range of motion, Decreased strength, Impaired UE functional use, Impaired flexibility  Visit Diagnosis: Acute  pain of right shoulder  Pain in right elbow  Stiffness of right shoulder, not elsewhere  classified  Stiffness of right elbow, not elsewhere classified  Muscle weakness (generalized)     Problem List Patient Active Problem List   Diagnosis Date Noted  . Impingement syndrome of right shoulder 05/28/2017  . Chronic right shoulder pain 04/05/2017  . Cubital tunnel syndrome on right 01/24/2017  . Paresthesias in right hand 01/02/2017  . Shoulder pain 08/22/2016  . Pure hypercholesterolemia 03/06/2016  . Strain of rotator cuff capsule 01/30/2016  . Lateral epicondylitis 01/30/2016  . Blood pressure elevated 02/11/2015  . GERD without esophagitis 02/11/2015    Taron Conrey C. Edilia Ghuman PT, DPT 07/18/17 9:25 AM   [place] [address] [place], [place], [address] Phone: [phone]   Fax:  [phone]  Name: Jason Love MRN: [id number] Date of Birth: 01-17-1976

## 2017-07-23 ENCOUNTER — Ambulatory Visit: Payer: BC Managed Care – PPO | Admitting: Physical Therapy

## 2017-07-23 ENCOUNTER — Encounter: Payer: Self-pay | Admitting: Physical Therapy

## 2017-07-23 DIAGNOSIS — M25521 Pain in right elbow: Secondary | ICD-10-CM

## 2017-07-23 DIAGNOSIS — M25511 Pain in right shoulder: Secondary | ICD-10-CM | POA: Diagnosis not present

## 2017-07-23 DIAGNOSIS — M25611 Stiffness of right shoulder, not elsewhere classified: Secondary | ICD-10-CM

## 2017-07-23 DIAGNOSIS — M25621 Stiffness of right elbow, not elsewhere classified: Secondary | ICD-10-CM

## 2017-07-23 DIAGNOSIS — M6281 Muscle weakness (generalized): Secondary | ICD-10-CM

## 2017-07-23 NOTE — Therapy (Signed)
[place], [place], [address] Phone: [phone]   Fax:  [phone]  Physical Therapy Treatment  Patient Details  Name: Jason Love MRN: [id number] Date of Birth: 1975-10-09 Referring Provider: Garald Balding, MD   Encounter Date: 07/23/2017  PT End of Session - 07/23/17 0854    Visit Number  6    Number of Visits  21    Date for PT Re-Evaluation  09/13/17    Authorization Type  BCBS- no visit limit    PT Start Time  6314 pt arrived late    PT Stop Time  0941    PT Time Calculation (min)  47 min    Activity Tolerance  Patient tolerated treatment well;Patient limited by pain    Behavior During Therapy  [facility] for tasks assessed/performed       Past Medical History:  Diagnosis Date  . GERD (gastroesophageal reflux disease)    mild-no meds used    Past Surgical History:  Procedure Laterality Date  . KNEE ARTHROSCOPY  1995  . MOUTH SURGERY  1999  . SHOULDER ARTHROSCOPY Left 2013  . SHOULDER ARTHROSCOPY Right 05/28/2017   Procedure: RIGHT SHOULDER ARTHROSCOPY, SUBACROMIAL DECOMPRESSION, DISTAL CLAVICLE RESECTION, POSSIBLE MINI OPEN ROTATOR CUFF REPAIR, [facility] PATCH;  Surgeon: Garald Balding, MD;  Location: [facility];  Service: Orthopedics;  Laterality: Right;  . ULNAR NERVE TRANSPOSITION Right 05/28/2017   Procedure: ULNAR NERVE DECOMPRESSION AT RIGHT ELBOW;  Surgeon: Garald Balding, MD;  Location: [facility];  Service: Orthopedics;  Laterality: Right;    There were no vitals filed for this visit.  Subjective Assessment - 07/23/17 0854    Subjective  Feeling sore today. Tried some of the exercises at home. Elbow feels like it is being stuck with a needle, a little bit of sensation running to hand. Inside of elbow is not as bad    Patient Stated Goals  decrease pain, improve strength level, back to PLOF    Currently in Pain?  Yes    Pain Score  8     Pain Location  Shoulder    Pain Orientation   Right    Pain Descriptors / Indicators  Sore    Pain Score  8    Pain Location  Elbow    Pain Orientation  Right    Pain Descriptors / Indicators  Sore                      [facility] Adult PT Treatment/Exercise - 07/23/17 0001      Shoulder Exercises: Seated   Flexion  AAROM with ranger    Other Seated Exercises  seated scapular retraction      Shoulder Exercises: ROM/Strengthening   Other ROM/Strengthening Exercises  UE ranger flexion, scaption, resisted row back- all below 90      Modalities   Modalities  Cryotherapy      Cryotherapy   Number Minutes Cryotherapy  15 Minutes with IFC    Cryotherapy Location  Shoulder    Type of Cryotherapy  Ice pack      Electrical Stimulation   Electrical Stimulation Location  Rt GHJ    Electrical Stimulation Action  IFC    Electrical Stimulation Parameters  15 min to tol with ice    Electrical Stimulation Goals  Pain      Manual Therapy   Joint Mobilization  lateral elbow glides    Passive ROM  GHJ to tolerance  PT Short Term Goals - 07/11/17 6203      PT SHORT TERM GOAL #1   Title  PROM GHJ  within 10 deg of Lt side, elbow flexion within 5 deg    Baseline  elbow ROM WNL , shoulder limited    Status  Partially Met      PT SHORT TERM GOAL #2   Title  Pt will be able to reach [facility] for dressing and reaching for small objects in high cabinets    Status  On-going      PT SHORT TERM GOAL #3   Title  grip strength to 75% of Lt side    Status  Unable to assess        PT Long Term Goals - 07/01/17 1308      PT LONG TERM GOAL #1   Title  Pt will be able to use Rt arm to lift objects overhead to return to work-related activities    Baseline  unable at eval    Time  10    Period  Weeks    Status  New    Target Date  09/13/17      PT LONG TERM GOAL #2   Title  Pt will not be limited by sensory disturbances in hand/elbow    Baseline  pain when wearing long sleeve shirts, difficulty with utilizing  grip strength     Time  10    Period  Weeks    Status  New    Target Date  09/13/17      PT LONG TERM GOAL #3   Title  Pt will be able to complete all househwork and ADLs with full use of dominant UE    Baseline  severe limitations at eval    Time  10    Period  Weeks    Status  New    Target Date  09/13/17      PT LONG TERM GOAL #4   Title  FOTO to 45% limited to indicate significant improvement in functional ability    Baseline  72% limited at eval    Time  10    Period  Weeks    Status  New    Target Date  09/13/17            Plan - 07/23/17 5597    Clinical Impression Statement  High levels of pain today. consistently able to perform PROM to 90 in flexion and abduction with painful, empty end feel.     PT Treatment/Interventions  ADLs/Self Care Home Management;Cryotherapy;Electrical Stimulation;Ultrasound;Traction;Moist Heat;Iontophoresis 33m/ml Dexamethasone;Therapeutic activities;Therapeutic exercise;Patient/family education;Neuromuscular re-education;Manual techniques;Passive range of motion;Taping;Dry needling    PT Next Visit Plan  continue GHJ ROM as tolerated, review prone exercises    PT Home Exercise Plan  passive elbow flexion, desensitization, scap retraction, PROM shoulder table slides; prone retraction, prone extension, flexion AAROM with wand standing    Consulted and Agree with Plan of Care  Patient       Patient will benefit from skilled therapeutic intervention in order to improve the following deficits and impairments:  Pain, Increased muscle spasms, Decreased activity tolerance, Decreased range of motion, Decreased strength, Impaired UE functional use, Impaired flexibility  Visit Diagnosis: Acute pain of right shoulder  Pain in right elbow  Stiffness of right shoulder, not elsewhere classified  Stiffness of right elbow, not elsewhere classified  Muscle weakness (generalized)     Problem List Patient Active Problem List   Diagnosis Date  Noted  .  Impingement syndrome of right shoulder 05/28/2017  . Chronic right shoulder pain 04/05/2017  . Cubital tunnel syndrome on right 01/24/2017  . Paresthesias in right hand 01/02/2017  . Shoulder pain 08/22/2016  . Pure hypercholesterolemia 03/06/2016  . Strain of rotator cuff capsule 01/30/2016  . Lateral epicondylitis 01/30/2016  . Blood pressure elevated 02/11/2015  . GERD without esophagitis 02/11/2015    Delsie Amador C. Calel Pisarski PT, DPT 07/23/17 9:30 AM   [place], [place], [address] Phone: [phone]   Fax:  [phone]  Name: Jason Love MRN: [id number] Date of Birth: 08-11-1975

## 2017-07-25 ENCOUNTER — Ambulatory Visit: Payer: BC Managed Care – PPO | Admitting: Physical Therapy

## 2017-07-25 DIAGNOSIS — M6281 Muscle weakness (generalized): Secondary | ICD-10-CM

## 2017-07-25 DIAGNOSIS — M25511 Pain in right shoulder: Secondary | ICD-10-CM

## 2017-07-25 DIAGNOSIS — M25521 Pain in right elbow: Secondary | ICD-10-CM

## 2017-07-25 DIAGNOSIS — M25611 Stiffness of right shoulder, not elsewhere classified: Secondary | ICD-10-CM

## 2017-07-25 DIAGNOSIS — M25621 Stiffness of right elbow, not elsewhere classified: Secondary | ICD-10-CM

## 2017-07-25 NOTE — Therapy (Signed)
[place], [place], [address] Phone: [phone]   Fax:  [phone]  Physical Therapy Treatment  Patient Details  Name: Jason Love MRN: [id number] Date of Birth: 1975-07-31 Referring Provider: Garald Balding, MD   Encounter Date: 07/25/2017  PT End of Session - 07/25/17 1006    Visit Number  7    Number of Visits  21    Date for PT Re-Evaluation  09/13/17    Authorization Type  BCBS- no visit limit    PT Start Time  1010    PT Stop Time  1110    PT Time Calculation (min)  60 min    Activity Tolerance  Patient tolerated treatment well    Behavior During Therapy  [facility] for tasks assessed/performed       Past Medical History:  Diagnosis Date  . GERD (gastroesophageal reflux disease)    mild-no meds used    Past Surgical History:  Procedure Laterality Date  . KNEE ARTHROSCOPY  1995  . MOUTH SURGERY  1999  . SHOULDER ARTHROSCOPY Left 2013  . SHOULDER ARTHROSCOPY Right 05/28/2017   Procedure: RIGHT SHOULDER ARTHROSCOPY, SUBACROMIAL DECOMPRESSION, DISTAL CLAVICLE RESECTION, POSSIBLE MINI OPEN ROTATOR CUFF REPAIR, [facility] PATCH;  Surgeon: Garald Balding, MD;  Location: [facility];  Service: Orthopedics;  Laterality: Right;  . ULNAR NERVE TRANSPOSITION Right 05/28/2017   Procedure: ULNAR NERVE DECOMPRESSION AT RIGHT ELBOW;  Surgeon: Garald Balding, MD;  Location: [place];  Service: Orthopedics;  Laterality: Right;    There were no vitals filed for this visit.  Subjective Assessment - 07/25/17 1009    Subjective  "Im not feeling too bad today and the pain is at 5-6/10, and has been consistent with his HEP"    Currently in Pain?  Yes    Pain Score  6     Pain Location  Shoulder    Pain Orientation  Right    Pain Descriptors / Indicators  Sore    Pain Type  Surgical pain    Aggravating Factors   using arm    Pain Relieving Factors  ice, meds    Pain Score  5    Pain Orientation  Right    Pain Descriptors / Indicators  Sore    Pain Type  Surgical pain    Aggravating Factors   touch                      [facility] Adult PT Treatment/Exercise - 07/25/17 1012      Shoulder Exercises: Prone   Retraction  10 reps;Right;Strengthening    Extension  10 reps;Right      Shoulder Exercises: Pulleys   Flexion  2 minutes      Shoulder Exercises: ROM/Strengthening   UBE (Upper Arm Bike)  Nu-step UE/LE L8 x 7 min    Other ROM/Strengthening Exercises  UE ranger flexion, scaption, resisted row back- all below 90    Other ROM/Strengthening Exercises  UE ranger flexion 4 x 10 increasing height of ranger each set       Cryotherapy   Number Minutes Cryotherapy  10 Minutes    Cryotherapy Location  Shoulder    Type of Cryotherapy  Ice pack      Electrical Stimulation   Electrical Stimulation Location  Rt GHJ    Electrical Stimulation Action  IFC    Electrical Stimulation Parameters  100% scan, L13 x 10 min    Electrical Stimulation  Goals  Pain      Manual Therapy   Manual therapy comments  ulnar nerve glide in supine    Joint Mobilization  lateral elbow glides, GHJ inferior/ posterior grade 3    Passive ROM  GHJ to tolerance with flexion/ abduction with gentle oscillations                PT Short Term Goals - 07/11/17 9528      PT SHORT TERM GOAL #1   Title  PROM GHJ  within 10 deg of Lt side, elbow flexion within 5 deg    Baseline  elbow ROM WNL , shoulder limited    Status  Partially Met      PT SHORT TERM GOAL #2   Title  Pt will be able to reach [facility] for dressing and reaching for small objects in high cabinets    Status  On-going      PT SHORT TERM GOAL #3   Title  grip strength to 75% of Lt side    Status  Unable to assess        PT Long Term Goals - 07/01/17 1308      PT LONG TERM GOAL #1   Title  Pt will be able to use Rt arm to lift objects overhead to return to work-related activities    Baseline  unable at eval    Time  10    Period   Weeks    Status  New    Target Date  09/13/17      PT LONG TERM GOAL #2   Title  Pt will not be limited by sensory disturbances in hand/elbow    Baseline  pain when wearing long sleeve shirts, difficulty with utilizing grip strength     Time  10    Period  Weeks    Status  New    Target Date  09/13/17      PT LONG TERM GOAL #3   Title  Pt will be able to complete all househwork and ADLs with full use of dominant UE    Baseline  severe limitations at eval    Time  10    Period  Weeks    Status  New    Target Date  09/13/17      PT LONG TERM GOAL #4   Title  FOTO to 45% limited to indicate significant improvement in functional ability    Baseline  72% limited at eval    Time  10    Period  Weeks    Status  New    Target Date  09/13/17            Plan - 07/25/17 1006    Clinical Impression Statement  pt reported decreased pain at 5/10 today . continued focus ing shoulder mobility and scapular stability strengthening. pt continues to report pain increased to 8/10 with shoulder movement but was able to tolerate todays exercises. continued E-stim with ice end of session which she reported relief of pain.     PT Treatment/Interventions  ADLs/Self Care Home Management;Cryotherapy;Electrical Stimulation;Ultrasound;Traction;Moist Heat;Iontophoresis 70m/ml Dexamethasone;Therapeutic activities;Therapeutic exercise;Patient/family education;Neuromuscular re-education;Manual techniques;Passive range of motion;Taping;Dry needling    PT Next Visit Plan  continue GHJ ROM as tolerated, review prone exercises    PT Home Exercise Plan  passive elbow flexion, desensitization, scap retraction, PROM shoulder table slides; prone retraction, prone extension, flexion AAROM with wand standing    Consulted and Agree with Plan of Care  Patient       Patient will benefit from skilled therapeutic intervention in order to improve the following deficits and impairments:  Pain, Increased muscle spasms,  Decreased activity tolerance, Decreased range of motion, Decreased strength, Impaired UE functional use, Impaired flexibility  Visit Diagnosis: Pain in right elbow  Acute pain of right shoulder  Stiffness of right shoulder, not elsewhere classified  Stiffness of right elbow, not elsewhere classified  Muscle weakness (generalized)     Problem List Patient Active Problem List   Diagnosis Date Noted  . Impingement syndrome of right shoulder 05/28/2017  . Chronic right shoulder pain 04/05/2017  . Cubital tunnel syndrome on right 01/24/2017  . Paresthesias in right hand 01/02/2017  . Shoulder pain 08/22/2016  . Pure hypercholesterolemia 03/06/2016  . Strain of rotator cuff capsule 01/30/2016  . Lateral epicondylitis 01/30/2016  . Blood pressure elevated 02/11/2015  . GERD without esophagitis 02/11/2015    Starr Lake PT, DPT, LAT, ATC  07/25/17  11:17 AM      [facility] [address] [place], [place], [address] Phone: [phone]   Fax:  [phone]  Name: Jason Love MRN: [id number] Date of Birth: [birth year]

## 2017-07-30 ENCOUNTER — Ambulatory Visit: Payer: BC Managed Care – PPO | Admitting: Physical Therapy

## 2017-08-01 ENCOUNTER — Encounter: Payer: Self-pay | Admitting: Physical Therapy

## 2017-08-01 ENCOUNTER — Ambulatory Visit: Payer: BC Managed Care – PPO | Admitting: Physical Therapy

## 2017-08-01 DIAGNOSIS — M25521 Pain in right elbow: Secondary | ICD-10-CM

## 2017-08-01 DIAGNOSIS — M25621 Stiffness of right elbow, not elsewhere classified: Secondary | ICD-10-CM

## 2017-08-01 DIAGNOSIS — M6281 Muscle weakness (generalized): Secondary | ICD-10-CM

## 2017-08-01 DIAGNOSIS — M25611 Stiffness of right shoulder, not elsewhere classified: Secondary | ICD-10-CM

## 2017-08-01 DIAGNOSIS — M25511 Pain in right shoulder: Secondary | ICD-10-CM | POA: Diagnosis not present

## 2017-08-01 NOTE — Therapy (Signed)
[place], [place], [address] Phone: [phone]   Fax:  [phone]  Physical Therapy Treatment  Patient Details  Name: Jason Love MRN: [id number] Date of Birth: Nov 13, 1975 Referring Provider: Garald Balding, MD   Encounter Date: 08/01/2017  PT End of Session - 08/01/17 0848    Visit Number  8    Number of Visits  21    Date for PT Re-Evaluation  09/13/17    Authorization Type  [employer]- no visit limit    PT Start Time  0848    PT Stop Time  0927    PT Time Calculation (min)  39 min    Activity Tolerance  Patient tolerated treatment well    Behavior During Therapy  [facility] for tasks assessed/performed       Past Medical History:  Diagnosis Date  . GERD (gastroesophageal reflux disease)    mild-no meds used    Past Surgical History:  Procedure Laterality Date  . KNEE ARTHROSCOPY  1995  . MOUTH SURGERY  1999  . SHOULDER ARTHROSCOPY Left 2013  . SHOULDER ARTHROSCOPY Right 05/28/2017   Procedure: RIGHT SHOULDER ARTHROSCOPY, SUBACROMIAL DECOMPRESSION, DISTAL CLAVICLE RESECTION, POSSIBLE MINI OPEN ROTATOR CUFF REPAIR, [facility] PATCH;  Surgeon: Garald Balding, MD;  Location: [place];  Service: Orthopedics;  Laterality: Right;  . ULNAR NERVE TRANSPOSITION Right 05/28/2017   Procedure: ULNAR NERVE DECOMPRESSION AT RIGHT ELBOW;  Surgeon: Garald Balding, MD;  Location: [place];  Service: Orthopedics;  Laterality: Right;    There were no vitals filed for this visit.  Subjective Assessment - 08/01/17 0849    Subjective  It's tight but it feels better. I have been trying to stretch it out a lot more.     Patient Stated Goals  decrease pain, improve strength level, back to PLOF    Currently in Pain?  Yes    Pain Score  4     Pain Location  Shoulder    Pain Orientation  Right    Pain Descriptors / Indicators  Tightness         [facility] PT Assessment - 08/01/17 0001      PROM   Right Shoulder  Flexion  125 Degrees supine with wand            No data recorded       [facility] Adult PT Treatment/Exercise - 08/01/17 0001      Shoulder Exercises: Supine   Flexion  AAROM;10 reps      Shoulder Exercises: Seated   Extension  AROM bil UE      Shoulder Exercises: Sidelying   External Rotation  15 reps    ABduction  10 reps      Shoulder Exercises: Standing   Extension  20 reps    Row  20 reps      Shoulder Exercises: ROM/Strengthening   Other ROM/Strengthening Exercises  wall ladder x4 flx  x4 scaption      Manual Therapy   Passive ROM  GHJ to tolerance               PT Short Term Goals - 07/11/17 0177      PT SHORT TERM GOAL #1   Title  PROM GHJ  within 10 deg of Lt side, elbow flexion within 5 deg    Baseline  elbow ROM WNL , shoulder limited    Status  Partially Met      PT SHORT TERM GOAL #  2   Title  Pt will be able to reach [facility] for dressing and reaching for small objects in high cabinets    Status  On-going      PT SHORT TERM GOAL #3   Title  grip strength to 75% of Lt side    Status  Unable to assess        PT Long Term Goals - 07/01/17 1308      PT LONG TERM GOAL #1   Title  Pt will be able to use Rt arm to lift objects overhead to return to work-related activities    Baseline  unable at eval    Time  10    Period  Weeks    Status  New    [place] Date  09/13/17      PT LONG TERM GOAL #2   Title  Pt will not be limited by sensory disturbances in hand/elbow    Baseline  pain when wearing long sleeve shirts, difficulty with utilizing grip strength     Time  10    Period  Weeks    Status  New    [place] Date  09/13/17      PT LONG TERM GOAL #3   Title  Pt will be able to complete all househwork and ADLs with full use of dominant UE    Baseline  severe limitations at eval    Time  10    Period  Weeks    Status  New    Target Date  09/13/17      PT LONG TERM GOAL #4   Title  FOTO to 45% limited to indicate significant improvement  in functional ability    Baseline  72% limited at eval    Time  10    Period  Weeks    Status  New    Target Date  09/13/17            Plan - 08/01/17 6761    Clinical Impression Statement  Significant improvements noted in avail ROM of GHJ with some pain at end ranges. Progressed strengthening today with good tolerance. Elbow ROM WFL and pt denies limitations in daily activities.     PT Treatment/Interventions  ADLs/Self Care Home Management;Cryotherapy;Electrical Stimulation;Ultrasound;Traction;Moist Heat;Iontophoresis 54m/ml Dexamethasone;Therapeutic activities;Therapeutic exercise;Patient/family education;Neuromuscular re-education;Manual techniques;Passive range of motion;Taping;Dry needling    PT Next Visit Plan  continue stretching & progressing strength training as tolerated. isometrics    PT Home Exercise Plan  passive elbow flexion, desensitization, scap retraction, PROM shoulder table slides; prone retraction, prone extension, flexion AAROM with wand standing; row, extension red tband & AROM, sidelying ER & abd to 90    Consulted and Agree with Plan of Care  Patient       Patient will benefit from skilled therapeutic intervention in order to improve the following deficits and impairments:  Pain, Increased muscle spasms, Decreased activity tolerance, Decreased range of motion, Decreased strength, Impaired UE functional use, Impaired flexibility  Visit Diagnosis: Pain in right elbow  Acute pain of right shoulder  Stiffness of right shoulder, not elsewhere classified  Stiffness of right elbow, not elsewhere classified  Muscle weakness (generalized)     Problem List Patient Active Problem List   Diagnosis Date Noted  . Impingement syndrome of right shoulder 05/28/2017  . Chronic right shoulder pain 04/05/2017  . Cubital tunnel syndrome on right 01/24/2017  . Paresthesias in right hand 01/02/2017  . Shoulder pain 08/22/2016  . Pure hypercholesterolemia 03/06/2016   .  Strain of rotator cuff capsule 01/30/2016  . Lateral epicondylitis 01/30/2016  . Blood pressure elevated 02/11/2015  . GERD without esophagitis 02/11/2015    Danilo Cappiello C. Lars Jeziorski PT, DPT 08/01/17 9:30 AM   [place], [place], [address] Phone: [phone]   Fax:  [phone]  Name: Jason Love MRN: [id number] Date of Birth: 01-18-1976

## 2017-08-06 ENCOUNTER — Ambulatory Visit: Payer: BC Managed Care – PPO | Admitting: Physical Therapy

## 2017-08-08 ENCOUNTER — Encounter: Payer: BC Managed Care – PPO | Admitting: Physical Therapy

## 2017-08-13 ENCOUNTER — Ambulatory Visit (INDEPENDENT_AMBULATORY_CARE_PROVIDER_SITE_OTHER): Payer: BC Managed Care – PPO | Admitting: Orthopaedic Surgery

## 2017-08-13 ENCOUNTER — Encounter (INDEPENDENT_AMBULATORY_CARE_PROVIDER_SITE_OTHER): Payer: Self-pay | Admitting: Orthopaedic Surgery

## 2017-08-13 DIAGNOSIS — G8929 Other chronic pain: Secondary | ICD-10-CM

## 2017-08-13 DIAGNOSIS — G5621 Lesion of ulnar nerve, right upper limb: Secondary | ICD-10-CM

## 2017-08-13 DIAGNOSIS — M25511 Pain in right shoulder: Secondary | ICD-10-CM

## 2017-08-13 NOTE — Progress Notes (Signed)
Office Visit Note   Patient: Jason Love           Date of Birth: 06-04-1975           MRN: [id number] Visit Date: 08/13/2017              Requested by: Kandyce Rud, MD [address] [facility] - [facility] [place], [place] [address] PCP: Kandyce Rud, MD   Assessment & Plan: Visit Diagnoses:  1. Chronic right shoulder pain   2. Cubital tunnel syndrome on right     Plan: Just over 2 months status post SCD DCR right shoulder and decompression of ulnar nerve right elbow.  Doing well.  Progressing with exercises.  Much better than he was a month ago.  Will continue with physical therapy.  Unable to return to work yet.  We will see back in 1 month  Follow-Up Instructions: Return in about 1 month (around 09/12/2017).   Orders:  No orders of the defined types were placed in this encounter.  No orders of the defined types were placed in this encounter.     Procedures: No procedures performed   Clinical Data: No additional findings.   Subjective: No chief complaint on file. Just over 2 months status post arthroscopic subacromial decompression and distal clavicle resection of right shoulder.  Still having a bit of stiffness but much better range of motion. Had decompression of the ulnar nerve along the medial aspect of the right elbow at the same surgery.  Feeling much better and fewer symptoms.  Has good sensation to his hands are no longer the shooting pain into his arm.  HPI  Review of Systems   Objective: Vital Signs: There were no vitals taken for this visit.  Physical Exam  Ortho Exam awake alert and oriented x3.  Comfortable sitting.  Incisions right elbow and right shoulder healed without problem.  Able to actively abduct over 90 degrees and flex just about full overhead motion right shoulder.  Negative impingement.  Skin intact.  Still little bit weak. Incision about right elbow medially is healed without problem.  No Tinel's.   Full range of motion of elbow.  Neurovascular exam intact and specifically ulnar innervated muscles and dermatome  Specialty Comments:  No specialty comments available.  Imaging: No results found.   PMFS History: Patient Active Problem List   Diagnosis Date Noted  . Impingement syndrome of right shoulder 05/28/2017  . Chronic right shoulder pain 04/05/2017  . Cubital tunnel syndrome on right 01/24/2017  . Paresthesias in right hand 01/02/2017  . Shoulder pain 08/22/2016  . Pure hypercholesterolemia 03/06/2016  . Strain of rotator cuff capsule 01/30/2016  . Lateral epicondylitis 01/30/2016  . Blood pressure elevated 02/11/2015  . GERD without esophagitis 02/11/2015   Past Medical History:  Diagnosis Date  . GERD (gastroesophageal reflux disease)    mild-no meds used    History reviewed. No pertinent family history.  Past Surgical History:  Procedure Laterality Date  . KNEE ARTHROSCOPY  1995  . MOUTH SURGERY  1999  . SHOULDER ARTHROSCOPY Left 2013  . SHOULDER ARTHROSCOPY Right 05/28/2017   Procedure: RIGHT SHOULDER ARTHROSCOPY, SUBACROMIAL DECOMPRESSION, DISTAL CLAVICLE RESECTION, POSSIBLE MINI OPEN ROTATOR CUFF REPAIR, [facility] PATCH;  Surgeon: Valeria Batman, MD;  [facility];  Service: Orthopedics;  Laterality: Right;  . ULNAR NERVE TRANSPOSITION Right 05/28/2017   Procedure: ULNAR NERVE DECOMPRESSION AT RIGHT ELBOW;  Surgeon: Valeria Batman, MD;  Location: [facility]  OR;  Service: Orthopedics;  Laterality: Right;   Social History   Occupational History  . Not on file  Tobacco Use  . Smoking status: Former Smoker    Last attempt to quit: 1999    Years since quitting: 20.2  . Smokeless tobacco: Never Used  Substance and Sexual Activity  . Alcohol use: Not on file    Comment: rare  . Drug use: No  . Sexual activity: Yes     Valeria BatmanPeter W Karinda Cabriales, MD   Note - This record has been created using [employer] software.  Chart creation errors have been sought, but may  not always  have been located. Such creation errors do not reflect on  the standard of medical care.

## 2017-08-19 ENCOUNTER — Ambulatory Visit (INDEPENDENT_AMBULATORY_CARE_PROVIDER_SITE_OTHER): Payer: BC Managed Care – PPO | Admitting: Orthopaedic Surgery

## 2017-09-09 ENCOUNTER — Encounter (INDEPENDENT_AMBULATORY_CARE_PROVIDER_SITE_OTHER): Payer: Self-pay | Admitting: Orthopaedic Surgery

## 2017-09-09 ENCOUNTER — Ambulatory Visit (INDEPENDENT_AMBULATORY_CARE_PROVIDER_SITE_OTHER): Payer: BC Managed Care – PPO | Admitting: Orthopaedic Surgery

## 2017-09-09 VITALS — BP 126/79 | HR 65 | Resp 16 | Ht 72.0 in | Wt 182.0 lb

## 2017-09-09 DIAGNOSIS — G8929 Other chronic pain: Secondary | ICD-10-CM

## 2017-09-09 DIAGNOSIS — M25511 Pain in right shoulder: Secondary | ICD-10-CM

## 2017-09-09 DIAGNOSIS — G5621 Lesion of ulnar nerve, right upper limb: Secondary | ICD-10-CM

## 2017-09-09 NOTE — Progress Notes (Signed)
Office Visit Note   Patient: Jason Love           Date of Birth: [birth year]           MRN: [id number] Visit Date: 09/09/2017              Requested by: Kandyce Rud, MD [address] [facility] [place], [place] [address] PCP: Kandyce Rud, MD   Assessment & Plan: Visit Diagnoses:  1. Cubital tunnel syndrome on right   2. Chronic right shoulder pain     Plan: Continue home exercises.  I do not believe he is ready to return to his prior employment with continued shoulder discomfort and stiffness with overhead activity.  We will plan to evaluate monthly.  He has not reached maximum improvement. Will have approximately 20% permanent partial impairment to the right upper extremity based on his 2 surgeries to the same extremity Follow-Up Instructions: Return in about 1 month (around 10/10/2017).   Orders:  No orders of the defined types were placed in this encounter.  No orders of the defined types were placed in this encounter.     Procedures: No procedures performed   Clinical Data: No additional findings.   Subjective: Chief Complaint  Patient presents with  . Right Shoulder - Routine Post Op, Pain  . Right Elbow - Pain  . Post-op Follow-up    05-28-17 SHOULDER SURGERY, STILL HAVING TIGHTNESS AND SOME PAIN IN SHOULDER AND ELBOW  Over 3 months status post arthroscopic debridement of right shoulder including subacromial decompression distal clavicle resection.  Also had decompression of the ulnar nerve at the right elbow the same surgery.  Feeling better in terms of the elbow surgery with less numbness and tingling and better strength in his right hand.  Still having some soreness in his right shoulder with a little bit of stiffness with full overhead activity.  Discontinued physical therapy 3 weeks ago.  Has not returned to work at [employer]  HPI  Review of Systems  Constitutional: Negative for fatigue and fever.  HENT: Negative  for ear pain.   Eyes: Negative for pain.  Respiratory: Negative for cough and shortness of breath.   Cardiovascular: Negative for leg swelling.  Gastrointestinal: Negative for constipation and diarrhea.  Genitourinary: Negative for difficulty urinating.  Musculoskeletal: Positive for neck pain. Negative for back pain.  Skin: Negative for rash.  Allergic/Immunologic: Negative for food allergies.  Neurological: Positive for weakness and numbness.  Hematological: Does not bruise/bleed easily.  Psychiatric/Behavioral: Positive for sleep disturbance.     Objective: Vital Signs: BP 126/79 (BP Location: Left Arm, Patient Position: Sitting, Cuff Size: Normal)   Pulse 65   Resp 16   Ht 6' (1.829 m)   Wt 182 lb (82.6 kg)   BMI 24.68 kg/m   Physical Exam  Ortho Exam awake alert and oriented x3.  Comfortable sitting.  No pain over the ulnar nerve or the incision along the medial aspect of right elbow.  No Tinel's.  Good strength with abduction and adduction of the hand.  Neurovascular exam intact.  Lacks about 20 degrees of full overhead motion.  Shoulders a little bit tight.  Sub-acromial pain at 90 degrees of abduction.  Minimally positive impingement.  No pain at the acromioclavicular joint.  Skin intact.  Incisions of healed.  No neck pain  Specialty Comments:  No specialty comments available.  Imaging: No results found.   PMFS History: Patient Active Problem List  Diagnosis Date Noted  . Impingement syndrome of right shoulder 05/28/2017  . Chronic right shoulder pain 04/05/2017  . Cubital tunnel syndrome on right 01/24/2017  . Paresthesias in right hand 01/02/2017  . Shoulder pain 08/22/2016  . Pure hypercholesterolemia 03/06/2016  . Strain of rotator cuff capsule 01/30/2016  . Lateral epicondylitis 01/30/2016  . Blood pressure elevated 02/11/2015  . GERD without esophagitis 02/11/2015   Past Medical History:  Diagnosis Date  . GERD (gastroesophageal reflux disease)     mild-no meds used    History reviewed. No pertinent family history.  Past Surgical History:  Procedure Laterality Date  . KNEE ARTHROSCOPY  1995  . MOUTH SURGERY  1999  . SHOULDER ARTHROSCOPY Left 2013  . SHOULDER ARTHROSCOPY Right 05/28/2017   Procedure: RIGHT SHOULDER ARTHROSCOPY, SUBACROMIAL DECOMPRESSION, DISTAL CLAVICLE RESECTION, POSSIBLE MINI OPEN ROTATOR CUFF REPAIR, [facility] PATCH;  Surgeon: Valeria Batman, MD;  [facility];  Service: Orthopedics;  Laterality: Right;  . ULNAR NERVE TRANSPOSITION Right 05/28/2017   Procedure: ULNAR NERVE DECOMPRESSION AT RIGHT ELBOW;  Surgeon: Valeria Batman, MD;  Location: [facility] OR;  Service: Orthopedics;  Laterality: Right;   Social History   Occupational History  . Not on file  Tobacco Use  . Smoking status: Former Smoker    Last attempt to quit: 1999    Years since quitting: 20.3  . Smokeless tobacco: Never Used  Substance and Sexual Activity  . Alcohol use: Not on file    Comment: rare  . Drug use: No  . Sexual activity: Yes

## 2017-09-10 ENCOUNTER — Telehealth (INDEPENDENT_AMBULATORY_CARE_PROVIDER_SITE_OTHER): Payer: Self-pay

## 2017-09-10 NOTE — Telephone Encounter (Signed)
This lady left me a voicemail wanting to set up a deposition with Dr. Cleophas Dunker. Please call.

## 2017-09-16 NOTE — Telephone Encounter (Signed)
Jane with Atty. Inis Sizer office called needing to schedule a deposition with Dr. Cleophas Dunker in June. The only date that I see that maybe available for the month of June is Monday June 17th, 2019. Dr. Cleophas Dunker will this date work for you to do the deposition at 5:00pm.

## 2017-09-17 ENCOUNTER — Encounter: Payer: Self-pay | Admitting: Physical Therapy

## 2017-09-17 NOTE — Telephone Encounter (Signed)
That should work.

## 2017-09-17 NOTE — Therapy (Signed)
[place], [place], [address] Phone: [phone]   Fax:  [phone]  Patient Details  Name: Jason Love MRN: [id number] Date of Birth: [birth year] Referring Provider:  No ref. provider found  Encounter Date: 09/17/2017   PHYSICAL THERAPY DISCHARGE SUMMARY  Visits from Start of Care: 8  Current functional level related to goals / functional outcomes: Has not returned since last scheduled session; DC per policy.    Remaining deficits: Unable to assess    Education / Equipment: Unable to assess  Plan: Patient agrees to discharge.  Patient goals were not met. Patient is being discharged due to not returning since the last visit.  ?????        Deniece Ree PT, DPT, CBIS  Supplemental Physical Therapist [place]   Pager [facility] [address] [place], [place], [address] Phone: [phone]   Fax:  [phone]

## 2017-09-20 NOTE — Telephone Encounter (Signed)
Jason Love with attorney Inis Sizer emailed today stating that the attorney will be in [place] the week of June 3rd. Jason Love asked if the deposition can be scheduled the week of June 10th?

## 2017-09-20 NOTE — Telephone Encounter (Signed)
Dr. Cleophas Dunker is on vacation that week. Sorry

## 2017-09-25 NOTE — Telephone Encounter (Signed)
Jan with [facility] office asked if Dr. Cleophas Dunker can be available to do the deposition on November 04, 2017 at 5:00pm?  Thanks,   Claris Che

## 2017-09-25 NOTE — Telephone Encounter (Signed)
Please advise. Thank you

## 2017-09-26 NOTE — Telephone Encounter (Signed)
Please see Dr. Whitfield's note below. Thank you. 

## 2017-09-26 NOTE — Telephone Encounter (Signed)
That time should work

## 2017-10-02 NOTE — Telephone Encounter (Signed)
Deposition is scheduled for Monday November 04, 2017 at 5:00pm with [facility] office.  Thanks,  Claris Che

## 2017-10-07 ENCOUNTER — Ambulatory Visit (INDEPENDENT_AMBULATORY_CARE_PROVIDER_SITE_OTHER): Payer: BC Managed Care – PPO | Admitting: Orthopaedic Surgery

## 2017-10-07 ENCOUNTER — Encounter (INDEPENDENT_AMBULATORY_CARE_PROVIDER_SITE_OTHER): Payer: Self-pay | Admitting: Orthopaedic Surgery

## 2017-10-07 VITALS — BP 139/81 | HR 78 | Ht 72.0 in | Wt 182.0 lb

## 2017-10-07 DIAGNOSIS — M7541 Impingement syndrome of right shoulder: Secondary | ICD-10-CM

## 2017-10-07 NOTE — Progress Notes (Signed)
Office Visit Note   Patient: Jason Love           Date of Birth: 02/11/1976           MRN: [id number] Visit Date: 10/07/2017              Requested by: Kandyce RudBabaoff, Marcus, MD [address] [facility] [place] [facility] [place], [place] [id number] PCP: Kandyce RudBabaoff, Marcus, MD   Assessment & Plan: Visit Diagnoses:  1. Impingement syndrome of right shoulder     Plan: 4 months status post decompression of ulnar nerve at the right elbow and feeling much better.  Only occasionally has any discomfort.  Also had an arthroscopic SCD DCR of the right shoulder at the same surgery.  Still having some stiffness and soreness.  Was feeling much better while he was in therapy but since he stopped he seems to have "gone down".  I would like to restart physical therapy for his right shoulder.  Continue to evaluate at 1 month intervals.  I think at 6 months or about 2 months from now he will of reached maximum improvement.  Consider subacromial cortisone injection when he returns no improvement.  Unable to work in his previous employment position  Follow-Up Instructions: Return in about 1 month (around 11/06/2017).   Orders:  No orders of the defined types were placed in this encounter.  No orders of the defined types were placed in this encounter.     Procedures: No procedures performed   Clinical Data: No additional findings.   Subjective: Chief Complaint  Patient presents with  . Right Elbow - Follow-up  . Right Shoulder - Follow-up  . Follow-up    R ELBOW AND SHOULDER 1 MO F/U STILL SOME WEAKNESS, NUMBNESS AND AT TIMES WAKES OUT OF SLEEP    HPI  Review of Systems  Constitutional: Negative for fatigue and fever.  HENT: Negative for ear pain.   Eyes: Negative for pain.  Respiratory: Negative for cough and shortness of breath.   Cardiovascular: Negative for leg swelling.  Gastrointestinal: Negative for constipation and diarrhea.  Genitourinary: Negative for  difficulty urinating.  Musculoskeletal: Positive for neck pain. Negative for back pain.  Skin: Negative for rash.  Allergic/Immunologic: Negative for food allergies.  Neurological: Positive for weakness and numbness.  Hematological: Does not bruise/bleed easily.  Psychiatric/Behavioral: Positive for sleep disturbance.     Objective: Vital Signs: BP 139/81 (BP Location: Left Arm, Patient Position: Sitting, Cuff Size: Normal)   Pulse 78   Ht 6' (1.829 m)   Wt 182 lb (82.6 kg)   BMI 24.68 kg/m   Physical Exam  Ortho Exam awake alert and oriented x3.  Comfortable sitting.  Scar about the medial aspect of the right elbow over the ulnar nerve has healed.  Some hypertrophy but no tingling over the nerve.  Good strength.  Good grip and release.  No numbness or tingling.  Subjectively considerably improved.  Still has some impingement symptoms of his right shoulder particularly in the lateral subacromial.  Empty can testing is negative.  I could not elicit any grinding or grating.  Lacks some degrees of full overhead motion.  Skin intact.  Biceps intact Specialty Comments:  No specialty comments available.  Imaging: No results found.   PMFS History: Patient Active Problem List   Diagnosis Date Noted  . Impingement syndrome of right shoulder 05/28/2017  . Chronic right shoulder pain 04/05/2017  . Cubital tunnel syndrome on right 01/24/2017  .  Paresthesias in right hand 01/02/2017  . Shoulder pain 08/22/2016  . Pure hypercholesterolemia 03/06/2016  . Strain of rotator cuff capsule 01/30/2016  . Lateral epicondylitis 01/30/2016  . Blood pressure elevated 02/11/2015  . GERD without esophagitis 02/11/2015   Past Medical History:  Diagnosis Date  . GERD (gastroesophageal reflux disease)    mild-no meds used    History reviewed. No pertinent family history.  Past Surgical History:  Procedure Laterality Date  . KNEE ARTHROSCOPY  1995  . MOUTH SURGERY  1999  . SHOULDER ARTHROSCOPY  Left 2013  . SHOULDER ARTHROSCOPY Right 05/28/2017   Procedure: RIGHT SHOULDER ARTHROSCOPY, SUBACROMIAL DECOMPRESSION, DISTAL CLAVICLE RESECTION, POSSIBLE MINI OPEN ROTATOR CUFF REPAIR, [facility] PATCH;  Surgeon: Valeria Batman, MD;  Location: [facility];  Service: Orthopedics;  Laterality: Right;  . ULNAR NERVE TRANSPOSITION Right 05/28/2017   Procedure: ULNAR NERVE DECOMPRESSION AT RIGHT ELBOW;  Surgeon: Valeria Batman, MD;  Location: [facility] OR;  Service: Orthopedics;  Laterality: Right;   Social History   Occupational History  . Not on file  Tobacco Use  . Smoking status: Former Smoker    Last attempt to quit: 1999    Years since quitting: 20.4  . Smokeless tobacco: Never Used  Substance and Sexual Activity  . Alcohol use: Not on file    Comment: rare  . Drug use: No  . Sexual activity: Yes

## 2017-10-15 ENCOUNTER — Ambulatory Visit: Payer: Self-pay | Admitting: Physical Therapy

## 2017-10-21 NOTE — Telephone Encounter (Signed)
Per Jan with Inis Sizerouglas [facility] office the deposition scheduled for July 1. 2019 at 5:00pm has been canceled.  Thanks,  Claris CheMargaret

## 2017-10-23 ENCOUNTER — Ambulatory Visit: Payer: Self-pay | Admitting: Physical Therapy

## 2017-11-15 ENCOUNTER — Ambulatory Visit (INDEPENDENT_AMBULATORY_CARE_PROVIDER_SITE_OTHER): Payer: Self-pay | Admitting: Orthopaedic Surgery

## 2017-11-15 ENCOUNTER — Encounter (INDEPENDENT_AMBULATORY_CARE_PROVIDER_SITE_OTHER): Payer: Self-pay | Admitting: Orthopaedic Surgery

## 2017-11-15 VITALS — BP 150/86 | HR 80 | Ht 72.0 in | Wt 183.0 lb

## 2017-11-15 DIAGNOSIS — M7541 Impingement syndrome of right shoulder: Secondary | ICD-10-CM

## 2017-11-15 NOTE — Progress Notes (Signed)
Office Visit Note   Patient: Jason MaineRichard B Canelo           Date of Birth: 02/22/1976           MRN: [id number] Visit Date: 11/15/2017              Requested by: Kandyce RudBabaoff, Marcus, MD [address] [facility] [place] [facility] [place], [place] [id number] PCP: Kandyce RudBabaoff, Marcus, MD   Assessment & Plan: Visit Diagnoses:  1. Impingement syndrome of right shoulder     Plan: Approximately 5-1/2 months status post arthroscopic debridement of right shoulder and release of ulnar nerve about the right elbow.  Doing well in terms of his ulnar nerve with minimal discomfort.  Only had about 3 weeks of therapy after the surgery and has had some recurrence of his pain.  When he was last year I suggested further therapy.  He is been seen by Dr. Ave Filterhandler is a second opinion who apparently agreed with the need for further therapy.  Hopefully that will be scheduled in the very near future.  I will plan to see him back in 6 weeks.  I would consider him disabled  Follow-Up Instructions: Return in about 6 weeks (around 12/27/2017).   Orders:  No orders of the defined types were placed in this encounter.  No orders of the defined types were placed in this encounter.     Procedures: No procedures performed   Clinical Data: No additional findings.   Subjective: Chief Complaint  Patient presents with  . Follow-up    R SHOULDER IMPENGEMENT, STILL VERY TIGHT, UNABLE TO LIFT ARM OVER SHOULDER  Approximately 5-1/2 months status post surgery as previously outlined to right upper extremity.  Not having any significant issues after the release of the ulnar nerve at the medial aspect of the right elbow.  Still having some stiffness with his right shoulder.  Has seen Dr. Ave Filterhandler for second opinion who apparently is agreed with the need for continued therapy.  Has not been back to work  HPI  Review of Systems  Constitutional: Negative for fatigue and fever.  HENT: Negative for ear pain.     Eyes: Negative for pain.  Respiratory: Negative for cough and shortness of breath.   Cardiovascular: Negative for leg swelling.  Gastrointestinal: Negative for constipation and diarrhea.  Genitourinary: Negative for difficulty urinating.  Musculoskeletal: Negative for back pain and neck pain.  Skin: Negative for rash.  Allergic/Immunologic: Negative for food allergies.  Neurological: Positive for weakness and numbness.  Hematological: Does not bruise/bleed easily.  Psychiatric/Behavioral: Positive for sleep disturbance.     Objective: Vital Signs: BP (!) 150/86 (BP Location: Left Arm, Patient Position: Sitting, Cuff Size: Normal)   Pulse 80   Ht 6' (1.829 m)   Wt 183 lb (83 kg)   BMI 24.82 kg/m   Physical Exam  Ortho Exam awake alert and oriented x2.  Comfortable sitting.  No distress.  Incision about the medial aspect of the right elbow was healed.  No skin Tinel's over the nerve.  Neurovascular exam intact.  Full range of motion of his elbow.  I could raise his right arm fully over his head and  he can maintain that position.  Some mild "stiffness" with abduction at 90 degrees of the left right elbow.  Negative impingement.  Negative empty can testing.  Mild discomfort over the acromioclavicular joint where the [facility] was resected.  Skin intact Specialty Comments:  No specialty comments  available.  Imaging: No results found.   PMFS History: Patient Active Problem List   Diagnosis Date Noted  . Impingement syndrome of right shoulder 05/28/2017  . Chronic right shoulder pain 04/05/2017  . Cubital tunnel syndrome on right 01/24/2017  . Paresthesias in right hand 01/02/2017  . Shoulder pain 08/22/2016  . Pure hypercholesterolemia 03/06/2016  . Strain of rotator cuff capsule 01/30/2016  . Lateral epicondylitis 01/30/2016  . Blood pressure elevated 02/11/2015  . GERD without esophagitis 02/11/2015   Past Medical History:  Diagnosis Date  . GERD (gastroesophageal  reflux disease)    mild-no meds used    History reviewed. No pertinent family history.  Past Surgical History:  Procedure Laterality Date  . KNEE ARTHROSCOPY  1995  . MOUTH SURGERY  1999  . SHOULDER ARTHROSCOPY Left 2013  . SHOULDER ARTHROSCOPY Right 05/28/2017   Procedure: RIGHT SHOULDER ARTHROSCOPY, SUBACROMIAL DECOMPRESSION, DISTAL CLAVICLE RESECTION, POSSIBLE MINI OPEN ROTATOR CUFF REPAIR, [facility] PATCH;  Surgeon: Valeria Batman, MD;  Location: [facility] OR;  Service: Orthopedics;  Laterality: Right;  . ULNAR NERVE TRANSPOSITION Right 05/28/2017   Procedure: ULNAR NERVE DECOMPRESSION AT RIGHT ELBOW;  Surgeon: Valeria Batman, MD;  Location: [facility] OR;  Service: Orthopedics;  Laterality: Right;   Social History   Occupational History  . Not on file  Tobacco Use  . Smoking status: Former Smoker    Last attempt to quit: 1999    Years since quitting: 20.5  . Smokeless tobacco: Never Used  Substance and Sexual Activity  . Alcohol use: Not on file    Comment: rare  . Drug use: No  . Sexual activity: Yes

## 2017-12-27 ENCOUNTER — Ambulatory Visit (INDEPENDENT_AMBULATORY_CARE_PROVIDER_SITE_OTHER): Payer: Self-pay | Admitting: Orthopaedic Surgery

## 2017-12-27 ENCOUNTER — Encounter (INDEPENDENT_AMBULATORY_CARE_PROVIDER_SITE_OTHER): Payer: Self-pay | Admitting: Orthopaedic Surgery

## 2017-12-27 VITALS — BP 134/81 | HR 72 | Ht 72.0 in | Wt 183.0 lb

## 2017-12-27 DIAGNOSIS — G8929 Other chronic pain: Secondary | ICD-10-CM

## 2017-12-27 DIAGNOSIS — M25511 Pain in right shoulder: Secondary | ICD-10-CM

## 2017-12-27 MED ORDER — BUPIVACAINE HCL 0.5 % IJ SOLN
2.0000 mL | INTRAMUSCULAR | Status: AC | PRN
  Administered 2017-12-27: 2 mL via INTRA_ARTICULAR

## 2017-12-27 MED ORDER — METHYLPREDNISOLONE ACETATE 40 MG/ML IJ SUSP
80.0000 mg | INTRAMUSCULAR | Status: AC | PRN
  Administered 2017-12-27: 80 mg

## 2017-12-27 MED ORDER — LIDOCAINE HCL 2 % IJ SOLN
2.0000 mL | INTRAMUSCULAR | Status: AC | PRN
  Administered 2017-12-27: 2 mL

## 2017-12-27 NOTE — Progress Notes (Signed)
Office Visit Note   Patient: Jason Love           Date of Birth: 11/20/1975           MRN: [id number] Visit Date: 12/27/2017              Requested by: Kandyce RudBabaoff, Marcus, MD [address] [facility] [place] [facility] [place], [place] [id number] PCP: Kandyce RudBabaoff, Marcus, MD   Assessment & Plan: Visit Diagnoses:  1. Chronic right shoulder pain     Plan: Approximately 7 months status post right upper extremity surgery as previously outlined.  Still having some trouble with his right shoulder.  Has not restarted physical therapy as suggested.  This is a [employer] injury.  Will inject the area of the acromioclavicular joint and subacromial space with cortisone today.  Reevaluate in 4 to 6 weeks.  I think therapy is going to be important in his recovery.  At some point over the next several months he will of reached maximum medical improvement.  Presently he is unable to work  Follow-Up Instructions: Return in about 1 month (around 01/27/2018).   Orders:  Orders Placed This Encounter  Procedures  . Large Joint Inj: R subacromial bursa   No orders of the defined types were placed in this encounter.     Procedures: Large Joint Inj: R subacromial bursa on 12/27/2017 11:25 AM Indications: pain and diagnostic evaluation Details: 25 G 1.5 in needle, anterolateral approach  Arthrogram: No  Medications: 2 mL lidocaine 2 %; 2 mL bupivacaine 0.5 %; 80 mg methylPREDNISolone acetate 40 MG/ML Consent was given by the patient. Immediately prior to procedure a time out was called to verify the correct patient, procedure, equipment, support staff and site/side marked as required. Patient was prepped and draped in the usual sterile fashion.       Clinical Data: No additional findings.   Subjective: Chief Complaint  Patient presents with  . Follow-up    06/01/17 R SHOULDER IMPENGEMENT.  Status post right shoulder arthroscopic SCD and DCR in January.  Also had  decompression of the ulnar nerve at the right elbow.  Doing well in terms of the ulnar nerve.  Still having some stiffness and soreness of the right shoulder.  He saw Dr. Ave Filterhandler is a second opinion who would suggested further therapy.  To date has not been scheduled he complains of stiffness and soreness of the right shoulder.  He has not been working  HPI  Review of Systems  Constitutional: Negative for fatigue and fever.  HENT: Negative for ear pain.   Eyes: Negative for pain.  Respiratory: Negative for cough and shortness of breath.   Cardiovascular: Negative for leg swelling.  Gastrointestinal: Negative for constipation and diarrhea.  Genitourinary: Negative for difficulty urinating.  Musculoskeletal: Negative for back pain and neck pain.  Skin: Negative for rash.  Allergic/Immunologic: Negative for food allergies.  Neurological: Positive for weakness and numbness.  Hematological: Does not bruise/bleed easily.  Psychiatric/Behavioral: Positive for sleep disturbance.     Objective: Vital Signs: BP 134/81 (BP Location: Right Arm, Patient Position: Sitting, Cuff Size: Normal)   Pulse 72   Ht 6' (1.829 m)   Wt 183 lb (83 kg)   BMI 24.82 kg/m   Physical Exam  Constitutional: He is oriented to person, place, and time. He appears well-developed and well-nourished.  HENT:  Mouth/Throat: Oropharynx is clear and moist.  Eyes: Pupils are equal, round, and reactive to light. EOM  are normal.  Pulmonary/Chest: Effort normal.  Neurological: He is alert and oriented to person, place, and time.  Skin: Skin is warm and dry.  Psychiatric: He has a normal mood and affect. His behavior is normal.    Ortho Exam right shoulder with tenderness over the area of the acromioclavicular joint and anterior subacromial region.  Minimal crepitation.  Good strength.  Biceps intact.  No ecchymosis.  No erythema.  Able to place his arm over his head.  Some discomfort placing his arm across his  chest  Specialty Comments:  No specialty comments available.  Imaging: No results found.   PMFS History: Patient Active Problem List   Diagnosis Date Noted  . Impingement syndrome of right shoulder 05/28/2017  . Chronic right shoulder pain 04/05/2017  . Cubital tunnel syndrome on right 01/24/2017  . Paresthesias in right hand 01/02/2017  . Shoulder pain 08/22/2016  . Pure hypercholesterolemia 03/06/2016  . Strain of rotator cuff capsule 01/30/2016  . Lateral epicondylitis 01/30/2016  . Blood pressure elevated 02/11/2015  . GERD without esophagitis 02/11/2015   Past Medical History:  Diagnosis Date  . GERD (gastroesophageal reflux disease)    mild-no meds used    History reviewed. No pertinent family history.  Past Surgical History:  Procedure Laterality Date  . KNEE ARTHROSCOPY  1995  . MOUTH SURGERY  1999  . SHOULDER ARTHROSCOPY Left 2013  . SHOULDER ARTHROSCOPY Right 05/28/2017   Procedure: RIGHT SHOULDER ARTHROSCOPY, SUBACROMIAL DECOMPRESSION, DISTAL CLAVICLE RESECTION, POSSIBLE MINI OPEN ROTATOR CUFF REPAIR, [facility] PATCH;  Surgeon: Valeria Batman, MD;  Location: [facility] OR;  Service: Orthopedics;  Laterality: Right;  . ULNAR NERVE TRANSPOSITION Right 05/28/2017   Procedure: ULNAR NERVE DECOMPRESSION AT RIGHT ELBOW;  Surgeon: Valeria Batman, MD;  Location: [facility] OR;  Service: Orthopedics;  Laterality: Right;   Social History   Occupational History  . Not on file  Tobacco Use  . Smoking status: Former Smoker    Last attempt to quit: 1999    Years since quitting: 20.6  . Smokeless tobacco: Never Used  Substance and Sexual Activity  . Alcohol use: Not on file    Comment: rare  . Drug use: No  . Sexual activity: Yes

## 2018-01-31 ENCOUNTER — Ambulatory Visit (INDEPENDENT_AMBULATORY_CARE_PROVIDER_SITE_OTHER): Payer: Self-pay | Admitting: Orthopaedic Surgery

## 2018-01-31 ENCOUNTER — Encounter (INDEPENDENT_AMBULATORY_CARE_PROVIDER_SITE_OTHER): Payer: Self-pay | Admitting: Orthopaedic Surgery

## 2018-01-31 DIAGNOSIS — M25511 Pain in right shoulder: Secondary | ICD-10-CM

## 2018-01-31 DIAGNOSIS — G8929 Other chronic pain: Secondary | ICD-10-CM

## 2018-01-31 NOTE — Progress Notes (Signed)
Office Visit Note   Patient: Jason Love           Date of Birth: Nov 15, 1975           MRN: [id number] Visit Date: 01/31/2018              Requested by: Kandyce Rud, MD [address] [facility] [place], [place] [address] PCP: Kandyce Rud, MD   Assessment & Plan: Visit Diagnoses:  1. Chronic right shoulder pain     Plan: Approximately 8 months status post right upper extremity surgery including arthroscopic SCD and DCR the right shoulder and decompression of the ulnar nerve at the right elbow.  Doing well in terms of the ulnar nerve surgery.  Still having some issues with his right shoulder.  This is a [employer] issue.  We have suggested physical therapy and apparently this has not been approved by [employer].  He is actually had another opinion by Dr. Ave Filter who apparently suggested  the same.  I think it is worth obtaining an MRI scan of the of his persistent symptoms.  He is not sure it really made much of a difference i.e. the surgery.  Would suggest that he continues not working. Follow-Up Instructions: Return in about 1 month (around 03/02/2018), or will schedule MRI right shoulder.   Orders:  Orders Placed This Encounter  Procedures  . MR SHOULDER RIGHT WO CONTRAST   No orders of the defined types were placed in this encounter.     Procedures: No procedures performed   Clinical Data: No additional findings.   Subjective: Chief Complaint  Patient presents with  . Follow-up    R SHOULDER PAIN, INJECTION DID NOT HELP. PAIN IN SHOULDER AND HAS SOME NUMBNESS IN ELBOW AND FINGERS AT TIMES  Mr. Jason Love returns to the office for reevaluation of the right upper extremity problems as previously mentioned.  He is about 8 months postop from an SCD and DCR of the right shoulder and decompression of the ulnar nerve at the right elbow.  He no longer has any issues with his hand or the numbness or tingling.  He is been  happy with the ulnar nerve surgery.  He still has problem with his right shoulder.  I have suggested repeat physical therapy.  This has not been approved by [employer]. and he has been in therapy" months.  He had another opinion by Dr. Ave Filter who also suggested repeat therapy.  He is having difficulty with overhead activity.  He does not feel like he can work with his shoulder in its present state.  No numbness or tingling  HPI  Review of Systems  Constitutional: Negative for fatigue and fever.  HENT: Negative for ear pain.   Eyes: Negative for pain.  Respiratory: Negative for cough and shortness of breath.   Cardiovascular: Negative for leg swelling.  Gastrointestinal: Negative for constipation and diarrhea.  Genitourinary: Negative for difficulty urinating.  Musculoskeletal: Positive for neck pain. Negative for back pain.  Skin: Negative for rash.  Allergic/Immunologic: Negative for food allergies.  Neurological: Positive for weakness and numbness.  Hematological: Does not bruise/bleed easily.  Psychiatric/Behavioral: Positive for sleep disturbance.     Objective: Vital Signs: BP 118/82 (BP Location: Left Arm, Patient Position: Sitting, Cuff Size: Normal)   Pulse 69   Ht 6' (1.829 m)   Wt 188 lb (85.3 kg)   BMI 25.50 kg/m   Physical Exam  Constitutional: He is  oriented to person, place, and time. He appears well-developed and well-nourished.  HENT:  Mouth/Throat: Oropharynx is clear and moist.  Eyes: Pupils are equal, round, and reactive to light. EOM are normal.  Pulmonary/Chest: Effort normal.  Neurological: He is alert and oriented to person, place, and time.  Skin: Skin is warm and dry.  Psychiatric: He has a normal mood and affect. His behavior is normal.    Ortho Exam alert and oriented x3.  Comfortable sitting.  Good grip and release right hand motor exam intact.  Good sensibility.  Incision along the medial aspect of the elbow is healed without tenderness.  He  does have some tenderness along the anterior subacromial region of his right shoulder.  He is able to place his arm over his head but it is uncomfortable and there is a circuitous motion.  Positive impingement.  Skin intact.  Biceps intact.  Negative Speed sign Specialty Comments:  No specialty comments available.  Imaging: No results found.   PMFS History: Patient Active Problem List   Diagnosis Date Noted  . Impingement syndrome of right shoulder 05/28/2017  . Chronic right shoulder pain 04/05/2017  . Cubital tunnel syndrome on right 01/24/2017  . Paresthesias in right hand 01/02/2017  . Shoulder pain 08/22/2016  . Pure hypercholesterolemia 03/06/2016  . Strain of rotator cuff capsule 01/30/2016  . Lateral epicondylitis 01/30/2016  . Blood pressure elevated 02/11/2015  . GERD without esophagitis 02/11/2015   Past Medical History:  Diagnosis Date  . GERD (gastroesophageal reflux disease)    mild-no meds used    History reviewed. No pertinent family history.  Past Surgical History:  Procedure Laterality Date  . KNEE ARTHROSCOPY  1995  . MOUTH SURGERY  1999  . SHOULDER ARTHROSCOPY Left 2013  . SHOULDER ARTHROSCOPY Right 05/28/2017   Procedure: RIGHT SHOULDER ARTHROSCOPY, SUBACROMIAL DECOMPRESSION, DISTAL CLAVICLE RESECTION, POSSIBLE MINI OPEN ROTATOR CUFF REPAIR, [facility] PATCH;  Surgeon: Valeria Batman, MD;  Location: [facility] OR;  Service: Orthopedics;  Laterality: Right;  . ULNAR NERVE TRANSPOSITION Right 05/28/2017   Procedure: ULNAR NERVE DECOMPRESSION AT RIGHT ELBOW;  Surgeon: Valeria Batman, MD;  Location: [facility] OR;  Service: Orthopedics;  Laterality: Right;   Social History   Occupational History  . Not on file  Tobacco Use  . Smoking status: Former Smoker    Last attempt to quit: 1999    Years since quitting: 20.7  . Smokeless tobacco: Never Used  Substance and Sexual Activity  . Alcohol use: Not on file    Comment: rare  . Drug use: No  . Sexual activity: Yes

## 2018-02-27 ENCOUNTER — Other Ambulatory Visit: Payer: Self-pay | Admitting: Orthopaedic Surgery

## 2018-02-27 ENCOUNTER — Other Ambulatory Visit (INDEPENDENT_AMBULATORY_CARE_PROVIDER_SITE_OTHER): Payer: Self-pay | Admitting: Orthopaedic Surgery

## 2018-02-27 DIAGNOSIS — M25511 Pain in right shoulder: Principal | ICD-10-CM

## 2018-02-27 DIAGNOSIS — G8929 Other chronic pain: Secondary | ICD-10-CM

## 2018-03-03 ENCOUNTER — Ambulatory Visit (INDEPENDENT_AMBULATORY_CARE_PROVIDER_SITE_OTHER): Payer: Self-pay | Admitting: Orthopaedic Surgery

## 2018-03-14 ENCOUNTER — Ambulatory Visit (HOSPITAL_COMMUNITY)
Admission: RE | Admit: 2018-03-14 | Discharge: 2018-03-14 | Disposition: A | Discharge status: Home / Self Care | Payer: Self-pay | Source: Ambulatory Visit | Attending: Orthopaedic Surgery | Admitting: Orthopaedic Surgery

## 2018-03-14 DIAGNOSIS — G8929 Other chronic pain: Secondary | ICD-10-CM | POA: Insufficient documentation

## 2018-03-14 DIAGNOSIS — M7581 Other shoulder lesions, right shoulder: Secondary | ICD-10-CM | POA: Insufficient documentation

## 2018-03-14 DIAGNOSIS — M25511 Pain in right shoulder: Secondary | ICD-10-CM | POA: Insufficient documentation

## 2018-03-24 ENCOUNTER — Encounter (INDEPENDENT_AMBULATORY_CARE_PROVIDER_SITE_OTHER): Payer: Self-pay | Admitting: Orthopaedic Surgery

## 2018-03-24 ENCOUNTER — Ambulatory Visit (INDEPENDENT_AMBULATORY_CARE_PROVIDER_SITE_OTHER): Payer: Self-pay | Admitting: Orthopaedic Surgery

## 2018-03-24 VITALS — BP 155/87 | HR 81 | Ht 72.0 in | Wt 188.0 lb

## 2018-03-24 DIAGNOSIS — M25511 Pain in right shoulder: Secondary | ICD-10-CM

## 2018-03-24 DIAGNOSIS — G8929 Other chronic pain: Secondary | ICD-10-CM

## 2018-03-24 NOTE — Progress Notes (Signed)
Office Visit Note   Patient: Jason MaineRichard B Ohair           Date of Birth: 02/22/1976           MRN: [id number] Visit Date: 03/24/2018              Requested by: Kandyce RudBabaoff, Marcus, MD [address] [facility] [place] [facility] [place], [place] [id number] PCP: Kandyce RudBabaoff, Marcus, MD   Assessment & Plan: Visit Diagnoses:  1. Chronic right shoulder pain     Plan: Mr. Christell ConstantMoore had a follow-up MRI scan of his right shoulder as he is been having persistent pain since his shoulder arthroscopy in January.  The scan demonstrates moderate tendinosis of the supra and infraspinatus tendon with some fraying on the bursal surface.  The teres minor and subscapularis are intact.  No atrophy or fatty replacement or any abnormal signal within the muscles of the rotator cuff.  Biceps tendon is intact.  Mild arthropathy of the acromioclavicular joint with a type I acromion.  No subacromial or subdeltoid fluid.  No joint effusion.  No chondral defect labrum intact.  No abnormality within the bone of the humeral joint. Mr. Christell ConstantMoore relates that he had some delayed healing when he had a similar problem with his left shoulder status post arthroscopy.  He also believes that with further physical therapy he would improve.  He did see Dr. Ave Filterhandler of for a second opinion through [facility] who agreed with the need for further physical therapy.  Since his postoperative therapy he has not had any further treatment.  I have injected the subacromial space in the [facility] joint in the past with some temporary relief.  I think he really needs to return to a rigorous course of physical therapy.  I am happy to call or write letters on his behalf.  If that is unsuccessful then it may be worth repeating the arthroscopy and considering an open resection of the distal clavicle and he is unable to return to his prior level of work activity  Follow-Up Instructions: Return in about 1 month (around 04/23/2018).   Orders:  No  orders of the defined types were placed in this encounter.  No orders of the defined types were placed in this encounter.     Procedures: No procedures performed   Clinical Data: No additional findings.   Subjective: Chief Complaint  Patient presents with  . Follow-up    MRI  SHOULDER REVIEW  No change in symptoms referable to his right shoulder.  Still having some difficulty actively raising his arm over his head.  Does have some mild impingement testing and some pain with crossarm test.  There is some local discomfort in the subacromial region anteriorly and even over the acromioclavicular joint.  The MRI scan results are as above.  There is some residual inflammatory change about the distal clavicle which I think might be symptomatic  HPI  Review of Systems  Constitutional: Negative for fatigue.  HENT: Negative for ear pain.   Eyes: Negative for pain.  Respiratory: Negative for cough and shortness of breath.   Cardiovascular: Negative for leg swelling.  Gastrointestinal: Negative for constipation and diarrhea.  Genitourinary: Negative for difficulty urinating.  Musculoskeletal: Negative for back pain and neck pain.  Skin: Negative for rash.  Allergic/Immunologic: Negative for food allergies.  Neurological: Positive for weakness and numbness.  Hematological: Does not bruise/bleed easily.  Psychiatric/Behavioral: Positive for sleep disturbance.     Objective: Vital  Signs: BP (!) 155/87 (BP Location: Left Arm, Patient Position: Sitting, Cuff Size: Normal)   Pulse 81   Ht 6' (1.829 m)   Wt 188 lb (85.3 kg)   BMI 25.50 kg/m   Physical Exam  Constitutional: He is oriented to person, place, and time. He appears well-developed and well-nourished.  HENT:  Mouth/Throat: Oropharynx is clear and moist.  Eyes: Pupils are equal, round, and reactive to light. EOM are normal.  Pulmonary/Chest: Effort normal.  Neurological: He is alert and oriented to person, place, and time.    Skin: Skin is warm and dry.  Psychiatric: He has a normal mood and affect. His behavior is normal.    Ortho Exam awake alert and oriented x3.  Comfortable sitting some mild pain with internal/external rotation of the right shoulder.  Some prominence and mild tenderness over the acromioclavicular joint.  No skin changes otherwise.  I can fully place the arm over his head with no pain referred to the right shoulder range of motion of the cervical spine  Specialty Comments:  No specialty comments available.  Imaging: No results found.   PMFS History: Patient Active Problem List   Diagnosis Date Noted  . Impingement syndrome of right shoulder 05/28/2017  . Chronic right shoulder pain 04/05/2017  . Cubital tunnel syndrome on right 01/24/2017  . Paresthesias in right hand 01/02/2017  . Shoulder pain 08/22/2016  . Pure hypercholesterolemia 03/06/2016  . Strain of rotator cuff capsule 01/30/2016  . Lateral epicondylitis 01/30/2016  . Blood pressure elevated 02/11/2015  . GERD without esophagitis 02/11/2015   Past Medical History:  Diagnosis Date  . GERD (gastroesophageal reflux disease)    mild-no meds used    History reviewed. No pertinent family history.  Past Surgical History:  Procedure Laterality Date  . KNEE ARTHROSCOPY  1995  . MOUTH SURGERY  1999  . SHOULDER ARTHROSCOPY Left 2013  . SHOULDER ARTHROSCOPY Right 05/28/2017   Procedure: RIGHT SHOULDER ARTHROSCOPY, SUBACROMIAL DECOMPRESSION, DISTAL CLAVICLE RESECTION, POSSIBLE MINI OPEN ROTATOR CUFF REPAIR, [facility] PATCH;  Surgeon: Valeria Batman, MD;  Location: [facility] OR;  Service: Orthopedics;  Laterality: Right;  . ULNAR NERVE TRANSPOSITION Right 05/28/2017   Procedure: ULNAR NERVE DECOMPRESSION AT RIGHT ELBOW;  Surgeon: Valeria Batman, MD;  Location: [facility] OR;  Service: Orthopedics;  Laterality: Right;   Social History   Occupational History  . Not on file  Tobacco Use  . Smoking status: Former Smoker    Last  attempt to quit: 1999    Years since quitting: 20.8  . Smokeless tobacco: Never Used  Substance and Sexual Activity  . Alcohol use: Not on file    Comment: rare  . Drug use: No  . Sexual activity: Yes

## 2018-04-21 ENCOUNTER — Ambulatory Visit (INDEPENDENT_AMBULATORY_CARE_PROVIDER_SITE_OTHER): Payer: Self-pay | Admitting: Orthopaedic Surgery

## 2018-04-21 ENCOUNTER — Encounter (INDEPENDENT_AMBULATORY_CARE_PROVIDER_SITE_OTHER): Payer: Self-pay | Admitting: Orthopaedic Surgery

## 2018-04-21 VITALS — BP 134/91 | HR 73 | Ht 72.0 in | Wt 190.0 lb

## 2018-04-21 DIAGNOSIS — M25511 Pain in right shoulder: Secondary | ICD-10-CM

## 2018-04-21 DIAGNOSIS — G8929 Other chronic pain: Secondary | ICD-10-CM
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
## 2019-04-09 ENCOUNTER — Other Ambulatory Visit: Payer: Self-pay

## 2019-04-09 DIAGNOSIS — Z20822 Contact with and (suspected) exposure to covid-19: Secondary | ICD-10-CM

## 2019-04-12 LAB — NOVEL CORONAVIRUS, NAA: SARS-CoV-2, NAA: DETECTED — AB

## 2021-08-16 ENCOUNTER — Ambulatory Visit (INDEPENDENT_AMBULATORY_CARE_PROVIDER_SITE_OTHER): Payer: BC Managed Care – PPO

## 2021-08-16 ENCOUNTER — Ambulatory Visit (INDEPENDENT_AMBULATORY_CARE_PROVIDER_SITE_OTHER): Payer: BC Managed Care – PPO | Admitting: Orthopaedic Surgery

## 2021-08-16 ENCOUNTER — Encounter: Payer: Self-pay | Admitting: Orthopaedic Surgery

## 2021-08-16 VITALS — Ht 72.0 in | Wt 195.0 lb

## 2021-08-16 DIAGNOSIS — M25522 Pain in left elbow: Secondary | ICD-10-CM

## 2021-08-16 DIAGNOSIS — M25561 Pain in right knee: Secondary | ICD-10-CM

## 2021-08-16 DIAGNOSIS — M7712 Lateral epicondylitis, left elbow: Secondary | ICD-10-CM | POA: Diagnosis not present
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
## 2021-09-07 ENCOUNTER — Encounter: Payer: Self-pay | Admitting: Orthopaedic Surgery

## 2021-09-07 ENCOUNTER — Ambulatory Visit (INDEPENDENT_AMBULATORY_CARE_PROVIDER_SITE_OTHER): Payer: BC Managed Care – PPO | Admitting: Orthopaedic Surgery

## 2021-09-07 DIAGNOSIS — M25561 Pain in right knee: Secondary | ICD-10-CM

## 2021-09-07 DIAGNOSIS — M25512 Pain in left shoulder: Secondary | ICD-10-CM | POA: Diagnosis not present

## 2021-09-07 DIAGNOSIS — M7712 Lateral epicondylitis, left elbow: Secondary | ICD-10-CM

## 2021-09-07 MED ORDER — METHYLPREDNISOLONE ACETATE 40 MG/ML IJ SUSP
80.0000 mg | INTRAMUSCULAR | Status: AC | PRN
  Administered 2021-09-07: 80 mg via INTRA_ARTICULAR

## 2021-09-07 MED ORDER — LIDOCAINE HCL 2 % IJ SOLN
2.0000 mL | INTRAMUSCULAR | Status: AC | PRN
  Administered 2021-09-07: 2 mL

## 2021-09-07 MED ORDER — BUPIVACAINE HCL 0.25 % IJ SOLN
2.0000 mL | INTRAMUSCULAR | Status: AC | PRN
  Administered 2021-09-07: 2 mL via INTRA_ARTICULAR
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
## 2021-09-21 ENCOUNTER — Ambulatory Visit (INDEPENDENT_AMBULATORY_CARE_PROVIDER_SITE_OTHER): Payer: BC Managed Care – PPO | Admitting: Orthopaedic Surgery

## 2021-09-21 ENCOUNTER — Encounter: Payer: Self-pay | Admitting: Orthopaedic Surgery

## 2021-09-21 DIAGNOSIS — M25522 Pain in left elbow: Secondary | ICD-10-CM | POA: Diagnosis not present

## 2021-09-21 DIAGNOSIS — M25512 Pain in left shoulder: Secondary | ICD-10-CM | POA: Diagnosis not present
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
## 2021-09-21 NOTE — Addendum Note (Signed)
Addended by: Wendi Maya on: 09/21/2021 03:38 PM   Modules accepted: Orders

## 2021-09-29 ENCOUNTER — Ambulatory Visit
Admission: RE | Admit: 2021-09-29 | Discharge: 2021-09-29 | Disposition: A | Discharge status: Home / Self Care | Payer: BC Managed Care – PPO | Source: Ambulatory Visit | Attending: Orthopaedic Surgery | Admitting: Orthopaedic Surgery

## 2021-09-29 DIAGNOSIS — M25512 Pain in left shoulder: Secondary | ICD-10-CM

## 2021-09-29 DIAGNOSIS — M25522 Pain in left elbow: Secondary | ICD-10-CM

## 2021-10-05 ENCOUNTER — Encounter: Payer: Self-pay | Admitting: Orthopaedic Surgery

## 2021-10-05 ENCOUNTER — Ambulatory Visit (INDEPENDENT_AMBULATORY_CARE_PROVIDER_SITE_OTHER): Payer: BC Managed Care – PPO | Admitting: Orthopaedic Surgery

## 2021-10-05 DIAGNOSIS — M7712 Lateral epicondylitis, left elbow: Secondary | ICD-10-CM

## 2021-10-05 DIAGNOSIS — M7542 Impingement syndrome of left shoulder: Secondary | ICD-10-CM
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
## 2021-10-12 ENCOUNTER — Other Ambulatory Visit: Payer: Self-pay

## 2021-10-12 ENCOUNTER — Ambulatory Visit (INDEPENDENT_AMBULATORY_CARE_PROVIDER_SITE_OTHER): Payer: BC Managed Care – PPO | Admitting: Rehabilitative and Restorative Service Providers"

## 2021-10-12 ENCOUNTER — Encounter: Payer: Self-pay | Admitting: Rehabilitative and Restorative Service Providers"

## 2021-10-12 DIAGNOSIS — M25632 Stiffness of left wrist, not elsewhere classified: Secondary | ICD-10-CM | POA: Diagnosis not present

## 2021-10-12 DIAGNOSIS — M25512 Pain in left shoulder: Secondary | ICD-10-CM

## 2021-10-12 DIAGNOSIS — M25612 Stiffness of left shoulder, not elsewhere classified: Secondary | ICD-10-CM

## 2021-10-12 DIAGNOSIS — M25522 Pain in left elbow: Secondary | ICD-10-CM

## 2021-10-12 DIAGNOSIS — G8929 Other chronic pain: Secondary | ICD-10-CM

## 2021-10-12 DIAGNOSIS — R202 Paresthesia of skin: Secondary | ICD-10-CM

## 2021-10-12 DIAGNOSIS — M6281 Muscle weakness (generalized): Secondary | ICD-10-CM
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
## 2021-10-17 ENCOUNTER — Encounter: Payer: Self-pay | Admitting: Rehabilitative and Restorative Service Providers"

## 2021-10-17 ENCOUNTER — Ambulatory Visit (INDEPENDENT_AMBULATORY_CARE_PROVIDER_SITE_OTHER): Payer: BC Managed Care – PPO | Admitting: Rehabilitative and Restorative Service Providers"

## 2021-10-17 DIAGNOSIS — G8929 Other chronic pain: Secondary | ICD-10-CM

## 2021-10-17 DIAGNOSIS — M25522 Pain in left elbow: Secondary | ICD-10-CM

## 2021-10-17 DIAGNOSIS — M25612 Stiffness of left shoulder, not elsewhere classified: Secondary | ICD-10-CM

## 2021-10-17 DIAGNOSIS — M25512 Pain in left shoulder: Secondary | ICD-10-CM

## 2021-10-17 DIAGNOSIS — M25632 Stiffness of left wrist, not elsewhere classified: Secondary | ICD-10-CM | POA: Diagnosis not present

## 2021-10-17 DIAGNOSIS — R202 Paresthesia of skin: Secondary | ICD-10-CM

## 2021-10-17 DIAGNOSIS — M6281 Muscle weakness (generalized): Secondary | ICD-10-CM
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
## 2021-10-19 ENCOUNTER — Ambulatory Visit (INDEPENDENT_AMBULATORY_CARE_PROVIDER_SITE_OTHER): Payer: BC Managed Care – PPO | Admitting: Rehabilitative and Restorative Service Providers"

## 2021-10-19 ENCOUNTER — Encounter: Payer: Self-pay | Admitting: Rehabilitative and Restorative Service Providers"

## 2021-10-19 DIAGNOSIS — M25512 Pain in left shoulder: Secondary | ICD-10-CM | POA: Diagnosis not present

## 2021-10-19 DIAGNOSIS — M25612 Stiffness of left shoulder, not elsewhere classified: Secondary | ICD-10-CM

## 2021-10-19 DIAGNOSIS — R202 Paresthesia of skin: Secondary | ICD-10-CM | POA: Diagnosis not present

## 2021-10-19 DIAGNOSIS — M25522 Pain in left elbow: Secondary | ICD-10-CM | POA: Diagnosis not present

## 2021-10-19 DIAGNOSIS — M25632 Stiffness of left wrist, not elsewhere classified: Secondary | ICD-10-CM

## 2021-10-19 DIAGNOSIS — M6281 Muscle weakness (generalized): Secondary | ICD-10-CM

## 2021-10-19 DIAGNOSIS — G8929 Other chronic pain: Secondary | ICD-10-CM
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
## 2021-10-23 ENCOUNTER — Telehealth: Payer: Self-pay | Admitting: Orthopaedic Surgery

## 2021-10-23 NOTE — Telephone Encounter (Signed)
[employer] forms received. To [employer]. 

## 2021-10-24 ENCOUNTER — Ambulatory Visit (INDEPENDENT_AMBULATORY_CARE_PROVIDER_SITE_OTHER): Payer: BC Managed Care – PPO | Admitting: Rehabilitative and Restorative Service Providers"

## 2021-10-24 ENCOUNTER — Encounter: Payer: Self-pay | Admitting: Rehabilitative and Restorative Service Providers"

## 2021-10-24 DIAGNOSIS — M25512 Pain in left shoulder: Secondary | ICD-10-CM

## 2021-10-24 DIAGNOSIS — R202 Paresthesia of skin: Secondary | ICD-10-CM

## 2021-10-24 DIAGNOSIS — M25522 Pain in left elbow: Secondary | ICD-10-CM | POA: Diagnosis not present

## 2021-10-24 DIAGNOSIS — M25632 Stiffness of left wrist, not elsewhere classified: Secondary | ICD-10-CM | POA: Diagnosis not present

## 2021-10-24 DIAGNOSIS — M6281 Muscle weakness (generalized): Secondary | ICD-10-CM

## 2021-10-24 DIAGNOSIS — M25612 Stiffness of left shoulder, not elsewhere classified: Secondary | ICD-10-CM | POA: Diagnosis not present

## 2021-10-24 DIAGNOSIS — G8929 Other chronic pain: Secondary | ICD-10-CM
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
## 2021-10-26 ENCOUNTER — Ambulatory Visit (INDEPENDENT_AMBULATORY_CARE_PROVIDER_SITE_OTHER): Payer: BC Managed Care – PPO | Admitting: Rehabilitative and Restorative Service Providers"

## 2021-10-26 ENCOUNTER — Encounter: Payer: Self-pay | Admitting: Rehabilitative and Restorative Service Providers"

## 2021-10-26 DIAGNOSIS — M25632 Stiffness of left wrist, not elsewhere classified: Secondary | ICD-10-CM

## 2021-10-26 DIAGNOSIS — M25512 Pain in left shoulder: Secondary | ICD-10-CM | POA: Diagnosis not present

## 2021-10-26 DIAGNOSIS — M25612 Stiffness of left shoulder, not elsewhere classified: Secondary | ICD-10-CM | POA: Diagnosis not present

## 2021-10-26 DIAGNOSIS — M6281 Muscle weakness (generalized): Secondary | ICD-10-CM

## 2021-10-26 DIAGNOSIS — G8929 Other chronic pain: Secondary | ICD-10-CM

## 2021-10-26 DIAGNOSIS — M25522 Pain in left elbow: Secondary | ICD-10-CM

## 2021-10-26 DIAGNOSIS — R202 Paresthesia of skin: Secondary | ICD-10-CM

## 2021-10-27 ENCOUNTER — Encounter: Payer: BC Managed Care – PPO | Admitting: Rehabilitative and Restorative Service Providers"

## 2021-10-31 ENCOUNTER — Ambulatory Visit (INDEPENDENT_AMBULATORY_CARE_PROVIDER_SITE_OTHER): Payer: BC Managed Care – PPO | Admitting: Rehabilitative and Restorative Service Providers"

## 2021-10-31 ENCOUNTER — Encounter: Payer: Self-pay | Admitting: Rehabilitative and Restorative Service Providers"

## 2021-10-31 DIAGNOSIS — G8929 Other chronic pain: Secondary | ICD-10-CM

## 2021-10-31 DIAGNOSIS — M25512 Pain in left shoulder: Secondary | ICD-10-CM

## 2021-10-31 DIAGNOSIS — M25522 Pain in left elbow: Secondary | ICD-10-CM | POA: Diagnosis not present

## 2021-10-31 DIAGNOSIS — M6281 Muscle weakness (generalized): Secondary | ICD-10-CM | POA: Diagnosis not present

## 2021-10-31 DIAGNOSIS — M25632 Stiffness of left wrist, not elsewhere classified: Secondary | ICD-10-CM

## 2021-10-31 DIAGNOSIS — M25612 Stiffness of left shoulder, not elsewhere classified: Secondary | ICD-10-CM

## 2021-10-31 DIAGNOSIS — R202 Paresthesia of skin: Secondary | ICD-10-CM

## 2021-11-02 ENCOUNTER — Encounter: Payer: Self-pay | Admitting: Rehabilitative and Restorative Service Providers"

## 2021-11-02 ENCOUNTER — Ambulatory Visit (INDEPENDENT_AMBULATORY_CARE_PROVIDER_SITE_OTHER): Payer: BC Managed Care – PPO | Admitting: Rehabilitative and Restorative Service Providers"

## 2021-11-02 DIAGNOSIS — G8929 Other chronic pain: Secondary | ICD-10-CM

## 2021-11-02 DIAGNOSIS — M25522 Pain in left elbow: Secondary | ICD-10-CM

## 2021-11-02 DIAGNOSIS — M25512 Pain in left shoulder: Secondary | ICD-10-CM | POA: Diagnosis not present

## 2021-11-02 DIAGNOSIS — M25612 Stiffness of left shoulder, not elsewhere classified: Secondary | ICD-10-CM | POA: Diagnosis not present

## 2021-11-02 DIAGNOSIS — M25632 Stiffness of left wrist, not elsewhere classified: Secondary | ICD-10-CM | POA: Diagnosis not present

## 2021-11-02 DIAGNOSIS — R202 Paresthesia of skin: Secondary | ICD-10-CM

## 2021-11-02 DIAGNOSIS — M6281 Muscle weakness (generalized): Secondary | ICD-10-CM
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
## 2021-11-21 ENCOUNTER — Encounter: Payer: Self-pay | Admitting: Orthopaedic Surgery

## 2021-11-21 ENCOUNTER — Ambulatory Visit (INDEPENDENT_AMBULATORY_CARE_PROVIDER_SITE_OTHER): Payer: BC Managed Care – PPO | Admitting: Orthopaedic Surgery

## 2021-11-21 DIAGNOSIS — M7712 Lateral epicondylitis, left elbow: Secondary | ICD-10-CM | POA: Diagnosis not present

## 2021-11-21 DIAGNOSIS — M7542 Impingement syndrome of left shoulder: Secondary | ICD-10-CM | POA: Diagnosis not present
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
## 2021-12-18 ENCOUNTER — Other Ambulatory Visit: Payer: Self-pay

## 2021-12-18 ENCOUNTER — Emergency Department
Admission: EM | Admit: 2021-12-18 | Discharge: 2021-12-19 | Disposition: A | Discharge status: Home / Self Care | Payer: BC Managed Care – PPO | Attending: Emergency Medicine | Admitting: Emergency Medicine

## 2021-12-18 ENCOUNTER — Emergency Department: Payer: BC Managed Care – PPO

## 2021-12-18 DIAGNOSIS — R221 Localized swelling, mass and lump, neck: Secondary | ICD-10-CM

## 2021-12-18 DIAGNOSIS — I10 Essential (primary) hypertension: Secondary | ICD-10-CM | POA: Insufficient documentation

## 2021-12-18 DIAGNOSIS — Z20822 Contact with and (suspected) exposure to covid-19: Secondary | ICD-10-CM | POA: Insufficient documentation

## 2021-12-18 LAB — RESP PANEL BY RT-PCR (FLU A&B, COVID) ARPGX2
Influenza A by PCR: NEGATIVE
Influenza B by PCR: NEGATIVE
SARS Coronavirus 2 by RT PCR: NEGATIVE

## 2021-12-18 LAB — CBC WITH DIFFERENTIAL/PLATELET
Abs Immature Granulocytes: 0.03 10*3/uL (ref 0.00–0.07)
Basophils Absolute: 0 10*3/uL (ref 0.0–0.1)
Basophils Relative: 0 %
Eosinophils Absolute: 0.1 10*3/uL (ref 0.0–0.5)
Eosinophils Relative: 1 %
HCT: 47.6 % (ref 39.0–52.0)
Hemoglobin: 16.2 g/dL (ref 13.0–17.0)
Immature Granulocytes: 0 %
Lymphocytes Relative: 35 %
Lymphs Abs: 3.5 10*3/uL (ref 0.7–4.0)
MCH: 30.6 pg (ref 26.0–34.0)
MCHC: 34 g/dL (ref 30.0–36.0)
MCV: 90 fL (ref 80.0–100.0)
Monocytes Absolute: 0.7 10*3/uL (ref 0.1–1.0)
Monocytes Relative: 7 %
Neutro Abs: 5.6 10*3/uL (ref 1.7–7.7)
Neutrophils Relative %: 57 %
Platelets: 248 10*3/uL (ref 150–400)
RBC: 5.29 MIL/uL (ref 4.22–5.81)
RDW: 12.3 % (ref 11.5–15.5)
WBC: 10 10*3/uL (ref 4.0–10.5)
nRBC: 0 % (ref 0.0–0.2)

## 2021-12-18 LAB — BASIC METABOLIC PANEL
Anion gap: 9 (ref 5–15)
BUN: 12 mg/dL (ref 6–20)
CO2: 26 mmol/L (ref 22–32)
Calcium: 9.5 mg/dL (ref 8.9–10.3)
Chloride: 105 mmol/L (ref 98–111)
Creatinine, Ser: 1.11 mg/dL (ref 0.61–1.24)
GFR, Estimated: >60 mL/min (ref >60)
Glucose, Bld: 102 mg/dL — ABNORMAL HIGH (ref 70–99)
Potassium: 3.5 mmol/L (ref 3.5–5.1)
Sodium: 140 mmol/L (ref 135–145)

## 2021-12-18 MED ORDER — IOHEXOL 300 MG/ML  SOLN
75.0000 mL | Freq: Once | INTRAMUSCULAR | Status: AC | PRN
  Administered 2021-12-18: 75 mL via INTRAVENOUS

## 2021-12-18 NOTE — ED Notes (Signed)
PT COMPLAINS OF LARGE SWOLLEN AREA IN THROAT

## 2021-12-18 NOTE — Discharge Instructions (Signed)
Your CAT scan did not show any abscess or abnormal lymph nodes.  Please follow-up with Dr. Shirlee Latch if you continue to have symptoms.

## 2021-12-18 NOTE — ED Provider Triage Note (Signed)
Emergency Medicine Provider Triage Evaluation Note  MAHLON GABRIELLE , a 46 y.o. male  was evaluated in triage.  Pt complains of sore thoat. Was given amox/prednisone 3 weeks ago, reports pain began again 2 days after finishing medicine. Thinks he has an abscess. He is able to swallow. No voice change.  Review of Systems  Positive: Sore throat Negative: fever  Physical Exam  There were no vitals taken for this visit. Gen:   Awake, no distress   Resp:  Normal effort  MSK:   Moves extremities without difficulty  Other:    Medical Decision Making  Medically screening exam initiated at 4:18 PM.  Appropriate orders placed.  Lucila Maine was informed that the remainder of the evaluation will be completed by another provider, this initial triage assessment does not replace that evaluation, and the importance of remaining in the ED until their evaluation is complete.     Jackelyn Hoehn, PA-C 12/18/21 1624

## 2021-12-18 NOTE — ED Provider Notes (Signed)
[facility] Provider Note    Event Date/Time   First MD Initiated Contact with Patient 12/18/21 2119     (approximate)   History   Abscess   HPI  LESLEY GALENTINE is a 46 y.o. male past medical history of GERD presents with concern for neck abscess.  Patient said that he had swelling in the left submandibular region about 3 to 4 weeks ago went to urgent care and was prescribed antibiotics and prednisone this was about 10-day course and symptoms resolved after the antibiotics were discontinued in 2 to 3 days symptoms returned.  He endorses feeling a swelling in the left submandibular region.  Feels like there is a clicking sensation when he swallows pain is mild denies fevers or chills denies sore throat.     Past Medical History:  Diagnosis Date   GERD (gastroesophageal reflux disease)    mild-no meds used    Patient Active Problem List   Diagnosis Date Noted   Impingement syndrome of left shoulder 10/05/2021   Pain in right knee 08/16/2021   Impingement syndrome of right shoulder 05/28/2017   Chronic right shoulder pain 04/05/2017   Cubital tunnel syndrome on right 01/24/2017   Paresthesias in right hand 01/02/2017   Shoulder pain 08/22/2016   Pure hypercholesterolemia 03/06/2016   Strain of rotator cuff capsule 01/30/2016   Lateral epicondylitis 01/30/2016   Blood pressure elevated 02/11/2015   GERD without esophagitis 02/11/2015     Physical Exam  Triage Vital Signs: ED Triage Vitals [12/18/21 1619]  Enc Vitals Group     BP (!) 151/100     Pulse Rate 79     Resp 18     Temp 98.5 F (36.9 C)     Temp Source Oral     SpO2 97 %     Weight      Height      Head Circumference      Peak Flow      Pain Score 3     Pain Loc      Pain Edu?      Excl. in GC?     Most recent vital signs: Vitals:   12/18/21 2200 12/18/21 2215  BP: (!) 157/113 (!) 154/103  Pulse: 66 60  Resp:    Temp:    SpO2: 100% 100%     General: Awake, no  distress.  CV:  Good peripheral perfusion.  Resp:  Normal effort.  Abd:  No distention.  Neuro:             Awake, Alert, Oriented x 3  Other:  Swelling noted in the left submandibular region it is nonmobile nontender not fluctuant no overlying skin changes Normal posterior oropharyngeal exam uvula is midline No salivary stones noted in parotid or submandibular duct No elevation of floor the mouth   ED Results / Procedures / Treatments  Labs (all labs ordered are listed, but only abnormal results are displayed) Labs Reviewed  BASIC METABOLIC PANEL - Abnormal; Notable for the following components:      Result Value   Glucose, Bld 102 (*)    All other components within normal limits  RESP PANEL BY RT-PCR (FLU A&B, COVID) ARPGX2  GROUP A STREP BY PCR  CBC WITH DIFFERENTIAL/PLATELET     EKG     RADIOLOGY    PROCEDURES:  Critical Care performed: No  Procedures  The patient is on the cardiac monitor to evaluate for evidence of arrhythmia and/or  significant heart rate changes.   MEDICATIONS ORDERED IN ED: Medications  iohexol (OMNIPAQUE) 300 MG/ML solution 75 mL (75 mLs Intravenous Contrast Given 12/18/21 2230)     IMPRESSION / MDM / ASSESSMENT AND PLAN / ED COURSE  I reviewed the triage vital signs and the nursing notes.                              Patient's presentation is most consistent with acute complicated illness / injury requiring diagnostic workup.  Differential diagnosis includes, but is not limited to, submandibular abscess, submandibular LAD, malignancy, cysts sialadenitis, sialolithiasis   46 year old male presenting with submandibular swelling mild pain and clicking sensation with swallowing.  Treated with antibiotics and steroids 3 to 4 weeks ago seem to improve but then is worsened since antibiotics were discontinued after about 2 days and has been stable since.  On exam he does have some nonmobile swelling in the left submental region no intraoral  findings no tender to palpation of the teeth.  I suspect that this is lymphadenopathy, will obtain CT neck to further evaluate.   Interestingly the CT of the neck shows just mildly enlarged [place]ine tonsils but no abnormality of the cervical lymph nodes or submandibular gland.  No abscess or phlegmon.  With this reassuring CT and low clinical suspicion for abscess on physical exam I think patient is appropriate to follow-up as an outpatient.  He has seen Dr. Shirlee Latch with ENT in the past advised to follow-up with ENT if symptoms are worsening.       FINAL CLINICAL IMPRESSION(S) / ED DIAGNOSES   Final diagnoses:  Neck swelling     Rx / DC Orders   ED Discharge Orders     None        Note:  This document was prepared using Dragon voice recognition software and may include unintentional dictation errors.   Georga Hacking, MD 12/18/21 [phone]

## 2021-12-18 NOTE — ED Triage Notes (Signed)
Pt to ED via POV from home. Pt has abscess in throat and was sent home on anx and prednisone. Pt reports it went away and after finishing medication 2 weeks ago but has since came back.

## 2022-01-25 ENCOUNTER — Other Ambulatory Visit: Payer: Self-pay | Admitting: Orthopaedic Surgery

## 2022-01-25 DIAGNOSIS — M7542 Impingement syndrome of left shoulder: Secondary | ICD-10-CM

## 2022-01-25 DIAGNOSIS — G5622 Lesion of ulnar nerve, left upper limb: Secondary | ICD-10-CM

## 2022-01-25 DIAGNOSIS — M19012 Primary osteoarthritis, left shoulder: Secondary | ICD-10-CM

## 2022-01-25 DIAGNOSIS — M7702 Medial epicondylitis, left elbow: Secondary | ICD-10-CM

## 2022-01-25 MED ORDER — OXYCODONE-ACETAMINOPHEN 5-325 MG PO TABS: 1.0000 | ORAL_TABLET | ORAL | 0 refills | Status: AC | PRN

## 2022-01-26 ENCOUNTER — Telehealth: Payer: Self-pay | Admitting: Orthopaedic Surgery

## 2022-01-26 NOTE — Telephone Encounter (Signed)
[facility] foms received. To [facility].

## 2022-02-01 ENCOUNTER — Ambulatory Visit (INDEPENDENT_AMBULATORY_CARE_PROVIDER_SITE_OTHER): Payer: BC Managed Care – PPO | Admitting: Orthopaedic Surgery

## 2022-02-01 ENCOUNTER — Encounter: Payer: Self-pay | Admitting: Orthopaedic Surgery

## 2022-02-01 DIAGNOSIS — G5622 Lesion of ulnar nerve, left upper limb: Secondary | ICD-10-CM | POA: Insufficient documentation

## 2022-02-01 DIAGNOSIS — M7542 Impingement syndrome of left shoulder: Secondary | ICD-10-CM

## 2022-02-01 DIAGNOSIS — M7712 Lateral epicondylitis, left elbow: Secondary | ICD-10-CM

## 2022-02-01 NOTE — Progress Notes (Signed)
Office Visit Note   Patient: Jason Love           Date of Birth: 26-Oct-1975           MRN: [id number] Visit Date: 02/01/2022              Requested by: Derinda Late, MD [address] [facility] [place],  [place] [address] PCP: Derinda Late, MD   Assessment & Plan: Visit Diagnoses:  1. Impingement syndrome of left shoulder   2. Lateral epicondylitis of left elbow   3. Cubital 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     Plan: Mr. Hinkle is 1 week status post left upper extremity surgery.  He had an arthroscopic SA D and DCR as well as a tennis elbow release and decompression of the ulnar nerve in the medial aspect of his left elbow.  Doing very well.  Has not taken any pain medicine treating it with ice and Tylenol.  The dressings were removed as well as the long-arm splint.  The wounds are healing without problem.  New Steri-Strips were applied to the medial lateral aspect of the left elbow and to the arthroscopic portals about the left shoulder.  He will continue with the sling and just start early range of motion.  I will plan to see him back in the office in a week and probably start therapy for shoulder.  He can already tell is a difference in the feeling in his little and ring finger  Follow-Up Instructions: Return in about 1 week (around 02/08/2022).   Orders:  No orders of the defined types were placed in this encounter.  No orders of the defined types were placed in this encounter.     Procedures: No procedures performed   Clinical Data: No additional findings.   Subjective: Chief Complaint  Patient presents with   Left Shoulder - Follow-up    Left shoulder arthroscopy 01/25/2022.    Left Elbow - Follow-up    Ulnar nerve decompression and tennis elbow release 01/25/2022  Patient presents today for a one week follow up on his left shoulder and left elbow. He had a left shoulder arthroscopy on 01/25/2022, along with left ulnar nerve  decompression and tennis elbow release. He states that he is doing well. He is taking over the counter pain medicine as needed.   HPI  Review of Systems   Objective: Vital Signs: There were no vitals taken for this visit.  Physical Exam  Ortho Exam left shoulder and left elbow incisions are healing without problem after removal of the dressings.  New Steri-Strips were applied.  Neurologically intact.  Notes subjectively better feeling in the left little and ring finger from his preoperative status.  No evidence of any wound infection  Specialty Comments:  No specialty comments available.  Imaging: No results found.   PMFS History: Patient Active Problem List   Diagnosis Date Noted   Cubital 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 02/01/2022   Impingement syndrome of left shoulder 10/05/2021   Pain in right knee 08/16/2021   Impingement syndrome of right shoulder 05/28/2017   Chronic right shoulder pain 04/05/2017   Cubital tunnel syndrome on right 01/24/2017   Paresthesias in right hand 01/02/2017   Shoulder pain 08/22/2016   Pure hypercholesterolemia 03/06/2016   Strain of rotator cuff capsule 01/30/2016   Lateral epicondylitis 01/30/2016   Blood pressure elevated 02/11/2015   GERD without esophagitis 02/11/2015   Past Medical History:  Diagnosis  Date   GERD (gastroesophageal reflux disease)    mild-no meds used    History reviewed. No pertinent family history.  Past Surgical History:  Procedure Laterality Date   KNEE ARTHROSCOPY  [place]   SHOULDER ARTHROSCOPY Left 2013   SHOULDER ARTHROSCOPY Right 05/28/2017   Procedure: RIGHT SHOULDER ARTHROSCOPY, SUBACROMIAL DECOMPRESSION, DISTAL CLAVICLE RESECTION, POSSIBLE MINI OPEN ROTATOR CUFF REPAIR, [facility] PATCH;  Surgeon: Garald Balding, MD;  Location: [facility];  Service: Orthopedics;  Laterality: Right;   ULNAR NERVE TRANSPOSITION Right 05/28/2017   Procedure: ULNAR NERVE DECOMPRESSION AT RIGHT ELBOW;  Surgeon:  Garald Balding, MD;  Location: [facility];  Service: Orthopedics;  Laterality: Right;   Social History   Occupational History   Not on file  Tobacco Use   Smoking status: Former    Types: Cigarettes    Quit date: 1999    Years since quitting: 24.7   Smokeless tobacco: Never  Vaping Use   Vaping Use: Never used  Substance and Sexual Activity   Alcohol use: Not on file    Comment: rare   Drug use: No   Sexual activity: Yes

## 2022-02-08 ENCOUNTER — Encounter: Payer: Self-pay | Admitting: Orthopaedic Surgery

## 2022-02-08 ENCOUNTER — Ambulatory Visit (INDEPENDENT_AMBULATORY_CARE_PROVIDER_SITE_OTHER): Payer: BC Managed Care – PPO | Admitting: Orthopaedic Surgery

## 2022-02-08 DIAGNOSIS — M7712 Lateral epicondylitis, left elbow: Secondary | ICD-10-CM

## 2022-02-08 DIAGNOSIS — M7542 Impingement syndrome of left shoulder: Secondary | ICD-10-CM

## 2022-02-08 DIAGNOSIS — G5622 Lesion of ulnar nerve, left upper limb: Secondary | ICD-10-CM

## 2022-02-08 NOTE — Progress Notes (Signed)
Office Visit Note   Patient: Jason Love           Date of Birth: Jan 20, 1976           MRN: [id number] Visit Date: 02/08/2022              Requested by: Kandyce Rud, MD [address] [facility] [place],  [place] [address] PCP: Kandyce Rud, MD   Assessment & Plan: Visit Diagnoses:  1. 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 Lateral epicondylitis of left elbow   3. Impingement syndrome of left shoulder     Plan: 2-week status post arthroscopic SCD and DCR left shoulder and decompression of the ulnar nerve at the left elbow and tennis elbow release same elbow.  Doing very well.  The dressings were removed the wounds look just fine.  All the wounds were closed with Steri-Strips.  There is no evidence of infection.  Neurologically intact.  Already can pronate and supinate without a problem is still little swollen about the elbow as expected.  He will just use the sling when he is out and about began flexion extension exercises of his elbow and the same with his shoulder I like to see him in a week and consider physical therapy.  Continues out of work and has not taken any pain medicines  Follow-Up Instructions: Return in about 1 week (around 02/15/2022).   Orders:  No orders of the defined types were placed in this encounter.  No orders of the defined types were placed in this encounter.     Procedures: No procedures performed   Clinical Data: No additional findings.   Subjective: Chief Complaint  Patient presents with   Left Shoulder - Follow-up    Left shoulder arthroscopy and tennis elbow release 01/25/2022   Left Elbow - Follow-up  Patient presents today for follow up on his left shoulder and elbow. He had a left shoulder arthroscopy and tennis elbow release on 01/25/2022. He is now 2 weeks out from surgery. Doing well.  HPI  Review of Systems   Objective: Vital Signs: There were no vitals taken for this  visit.  Physical Exam  Ortho Exam wounds left shoulder and medial and lateral aspect of left elbow healing without problem.  Dressings were removed.  Neurologically intact.  Still some swelling about the elbow but much less than even last week.  Swelling about the shoulder is also decreased.  All wounds look just fine.  Specialty Comments:  No specialty comments available.  Imaging: No results found.   PMFS History: Patient Active Problem List   Diagnosis Date Noted   Cubital 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 02/01/2022   Impingement syndrome of left shoulder 10/05/2021   Pain in right knee 08/16/2021   Impingement syndrome of right shoulder 05/28/2017   Chronic right shoulder pain 04/05/2017   Cubital tunnel syndrome on right 01/24/2017   Paresthesias in right hand 01/02/2017   Shoulder pain 08/22/2016   Pure hypercholesterolemia 03/06/2016   Strain of rotator cuff capsule 01/30/2016   Lateral epicondylitis 01/30/2016   Blood pressure elevated 02/11/2015   GERD without esophagitis 02/11/2015   Past Medical History:  Diagnosis Date   GERD (gastroesophageal reflux disease)    mild-no meds used    History reviewed. No pertinent family history.  Past Surgical History:  Procedure Laterality Date   KNEE ARTHROSCOPY  1995   MOUTH SURGERY  1999   SHOULDER ARTHROSCOPY Left 2013  SHOULDER ARTHROSCOPY Right 05/28/2017   Procedure: RIGHT SHOULDER ARTHROSCOPY, SUBACROMIAL DECOMPRESSION, DISTAL CLAVICLE RESECTION, POSSIBLE MINI OPEN ROTATOR CUFF REPAIR, [facility] PATCH;  Surgeon: Garald Balding, MD;  Location: [facility];  Service: Orthopedics;  Laterality: Right;   ULNAR NERVE TRANSPOSITION Right 05/28/2017   Procedure: ULNAR NERVE DECOMPRESSION AT RIGHT ELBOW;  Surgeon: Garald Balding, MD;  Location: [facility];  Service: Orthopedics;  Laterality: Right;   Social History   Occupational History   Not on file  Tobacco Use   Smoking status: Former    Types: Cigarettes    Quit date: 1999     Years since quitting: 24.7   Smokeless tobacco: Never  Vaping Use   Vaping Use: Never used  Substance and Sexual Activity   Alcohol use: Not on file    Comment: rare   Drug use: No   Sexual activity: Yes

## 2022-02-14 ENCOUNTER — Ambulatory Visit (INDEPENDENT_AMBULATORY_CARE_PROVIDER_SITE_OTHER): Payer: BC Managed Care – PPO | Admitting: Orthopaedic Surgery

## 2022-02-14 ENCOUNTER — Encounter: Payer: Self-pay | Admitting: Orthopaedic Surgery

## 2022-02-14 ENCOUNTER — Encounter: Payer: BC Managed Care – PPO | Admitting: Orthopaedic Surgery

## 2022-02-14 DIAGNOSIS — M7542 Impingement syndrome of left shoulder: Secondary | ICD-10-CM

## 2022-02-14 DIAGNOSIS — G5622 Lesion of ulnar nerve, left upper limb: Secondary | ICD-10-CM

## 2022-02-14 NOTE — Progress Notes (Signed)
Office Visit Note   Patient: Jason Love           Date of Birth: 1975/10/21           MRN: [id number] Visit Date: 02/14/2022              Requested by: Derinda Late, MD [address] [facility] [place],  [place] [address] PCP: Derinda Late, MD   Assessment & Plan: Visit Diagnoses:  1. Impingement syndrome of left shoulder   2. Cubital 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     Plan: Jason Love is a pleasant 46 year old gentleman who is 3 weeks status post left shoulder subacromial decompression and distal clavicle excision and left elbow tennis elbow release and ulnar nerve decompression.  Overall he is doing well he is having some problems with swelling especially towards the end of the day.  He is getting some what he describes as lightning type sensations along the ulnar nerve which are probably attributed to some swelling.  Neurovascularly he is intact.  He does have full pronation actively and passively and almost full supination passively.  He has almost full extension of his arm.  We will refer him to occupational therapy.  Should discontinue the sling we will follow-up in 2 weeks  Follow-Up Instructions: Return in about 2 weeks (around 02/28/2022).   Orders:  No orders of the defined types were placed in this encounter.  No orders of the defined types were placed in this encounter.     Procedures: No procedures performed   Clinical Data: No additional findings.   Subjective: Chief Complaint  Patient presents with   Left Elbow - Routine Post Op    HPI Jason Love is a pleasant 46 year old gentleman who is 3 weeks status post left shoulder subacromial decompression distal clavicle excision and tennis elbow release with ulnar nerve decompression.  Overall he is doing well complaining of some swelling and some electrical sensation along the ulnar nerve  Review of Systems   Objective: Vital Signs: There were no vitals taken for this  visit.  Physical Exam Constitutional:      Appearance: Normal appearance.  Pulmonary:     Effort: Pulmonary effort is normal.  Neurological:     Mental Status: He is alert.     Ortho Exam Examination of his left shoulder well-healed surgical portals he has difficulty with forward elevation.  His left hand is neurovascularly intact with good strength with resisted abduction of his fingers.  He does have some sensitivity along the ulnar nerve distribution.  He has full passive supination and pronation and full active pronation.  His surgical scars are well-healed without erythema erythema he does have some soft tissue swelling about the elbow distal pulses are intact Specialty Comments:  No specialty comments available.  Imaging: No results found.   PMFS History: Patient Active Problem List   Diagnosis Date Noted   Cubital 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 02/01/2022   Impingement syndrome of left shoulder 10/05/2021   Pain in right knee 08/16/2021   Impingement syndrome of right shoulder 05/28/2017   Chronic right shoulder pain 04/05/2017   Cubital tunnel syndrome on right 01/24/2017   Paresthesias in right hand 01/02/2017   Shoulder pain 08/22/2016   Pure hypercholesterolemia 03/06/2016   Strain of rotator cuff capsule 01/30/2016   Lateral epicondylitis 01/30/2016   Blood pressure elevated 02/11/2015   GERD without esophagitis 02/11/2015   Past Medical History:  Diagnosis Date  GERD (gastroesophageal reflux disease)    mild-no meds used    History reviewed. No pertinent family history.  Past Surgical History:  Procedure Laterality Date   KNEE ARTHROSCOPY  [place]   SHOULDER ARTHROSCOPY Left 2013   SHOULDER ARTHROSCOPY Right 05/28/2017   Procedure: RIGHT SHOULDER ARTHROSCOPY, SUBACROMIAL DECOMPRESSION, DISTAL CLAVICLE RESECTION, POSSIBLE MINI OPEN ROTATOR CUFF REPAIR, [facility] PATCH;  Surgeon: Garald Balding, MD;  Location: [place];  Service: Orthopedics;   Laterality: Right;   ULNAR NERVE TRANSPOSITION Right 05/28/2017   Procedure: ULNAR NERVE DECOMPRESSION AT RIGHT ELBOW;  Surgeon: Garald Balding, MD;  Location: [place];  Service: Orthopedics;  Laterality: Right;   Social History   Occupational History   Not on file  Tobacco Use   Smoking status: Former    Types: Cigarettes    Quit date: 1999    Years since quitting: 24.7   Smokeless tobacco: Never  Vaping Use   Vaping Use: Never used  Substance and Sexual Activity   Alcohol use: Not on file    Comment: rare   Drug use: No   Sexual activity: Yes
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
## 2022-02-20 ENCOUNTER — Ambulatory Visit (INDEPENDENT_AMBULATORY_CARE_PROVIDER_SITE_OTHER): Payer: BC Managed Care – PPO | Admitting: Rehabilitative and Restorative Service Providers"

## 2022-02-20 DIAGNOSIS — M6281 Muscle weakness (generalized): Secondary | ICD-10-CM

## 2022-02-20 DIAGNOSIS — M25522 Pain in left elbow: Secondary | ICD-10-CM

## 2022-02-20 DIAGNOSIS — R600 Localized edema: Secondary | ICD-10-CM

## 2022-02-20 DIAGNOSIS — M25512 Pain in left shoulder: Secondary | ICD-10-CM

## 2022-02-20 DIAGNOSIS — M25612 Stiffness of left shoulder, not elsewhere classified: Secondary | ICD-10-CM

## 2022-02-20 DIAGNOSIS — M25622 Stiffness of left elbow, not elsewhere classified: Secondary | ICD-10-CM

## 2022-02-20 DIAGNOSIS — M25632 Stiffness of left wrist, not elsewhere classified: Secondary | ICD-10-CM

## 2022-02-20 DIAGNOSIS — R278 Other lack of coordination: Secondary | ICD-10-CM
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
## 2022-02-27 ENCOUNTER — Encounter: Payer: Self-pay | Admitting: Rehabilitative and Restorative Service Providers"

## 2022-02-27 ENCOUNTER — Ambulatory Visit (INDEPENDENT_AMBULATORY_CARE_PROVIDER_SITE_OTHER): Payer: BC Managed Care – PPO | Admitting: Rehabilitative and Restorative Service Providers"

## 2022-02-27 DIAGNOSIS — M25612 Stiffness of left shoulder, not elsewhere classified: Secondary | ICD-10-CM

## 2022-02-27 DIAGNOSIS — M6281 Muscle weakness (generalized): Secondary | ICD-10-CM

## 2022-02-27 DIAGNOSIS — M25512 Pain in left shoulder: Secondary | ICD-10-CM

## 2022-02-27 DIAGNOSIS — M25632 Stiffness of left wrist, not elsewhere classified: Secondary | ICD-10-CM | POA: Diagnosis not present

## 2022-02-27 DIAGNOSIS — R278 Other lack of coordination: Secondary | ICD-10-CM

## 2022-02-27 DIAGNOSIS — M25622 Stiffness of left elbow, not elsewhere classified: Secondary | ICD-10-CM

## 2022-02-27 DIAGNOSIS — M25522 Pain in left elbow: Secondary | ICD-10-CM

## 2022-02-27 DIAGNOSIS — G8929 Other chronic pain: Secondary | ICD-10-CM

## 2022-02-27 DIAGNOSIS — R600 Localized edema: Secondary | ICD-10-CM

## 2022-02-27 DIAGNOSIS — R202 Paresthesia of skin: Secondary | ICD-10-CM

## 2022-02-28 ENCOUNTER — Ambulatory Visit (INDEPENDENT_AMBULATORY_CARE_PROVIDER_SITE_OTHER): Payer: BC Managed Care – PPO | Admitting: Orthopaedic Surgery

## 2022-02-28 ENCOUNTER — Encounter: Payer: Self-pay | Admitting: Orthopaedic Surgery

## 2022-02-28 DIAGNOSIS — M7542 Impingement syndrome of left shoulder: Secondary | ICD-10-CM

## 2022-02-28 DIAGNOSIS — G5622 Lesion of ulnar nerve, left upper limb: Secondary | ICD-10-CM
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
## 2022-02-28 NOTE — Progress Notes (Signed)
Office Visit Note   Patient: Jason Love           Date of Birth: 1975/06/25           MRN: [id number] Visit Date: 02/28/2022              Requested by: Derinda Late, MD [address] [place],  [place] [id number] PCP: Derinda Late, MD   Assessment & Plan: Visit Diagnoses:  1. 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 Impingement syndrome of left shoulder     Plan: Jason Love is now 5 weeks status post left shoulder subacromial decompression and distal clavicle excision.  Left tennis elbow release and ulnar nerve decompression.  He is doing well.  He says he still has some trouble sleeping and gets sore at the end of the day but feels better than prior to surgery.  He has begun working with occupational therapy his incisions have healed well.  He did say he had a small bit of drainage about a week ago could have been secondary to a small Vicryl suture abscess.  No evidence of any erythema or infection lower pain today.  He will continue with occupational therapy.  We will follow-up in 1 month.  Would expect that to be his final visit  Follow-Up Instructions: Return in about 1 month (around 03/31/2022).   Orders:  No orders of the defined types were placed in this encounter.  No orders of the defined types were placed in this encounter.     Procedures: No procedures performed   Clinical Data: No additional findings.   Subjective: Chief Complaint  Patient presents with   Left Shoulder - Pain    HPI Jason Love comes in today 5 weeks status post left subacromial decompression, distal clavicle excision tennis elbow release and ulnar nerve decompression.  He has no complaints and reports he is doing well  Review of Systems  All other systems reviewed and are negative.    Objective: Vital Signs: There were no vitals taken for this visit.  Physical Exam Constitutional:      Appearance: Normal appearance.  Skin:     General: Skin is warm and dry.  Neurological:     General: No focal deficit present.     Mental Status: He is alert.     Ortho Exam Examination of his surgical incisions demonstrate well-healed surgical surgical incisions about his shoulder and left elbow.  Sensation is intact in his ulnar nerve distribution in his hand.  He has brisk capillary refill pulses are intact.  Good grip strength.  He has full forward elevation though states he does feel little bit tight with elevating his arm overhead.  He has full extension of his elbow Specialty Comments:  No specialty comments available.  Imaging: No results found.   PMFS History: Patient Active Problem List   Diagnosis Date Noted   Cubital 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 02/01/2022   Impingement syndrome of left shoulder 10/05/2021   Pain in right knee 08/16/2021   Impingement syndrome of right shoulder 05/28/2017   Chronic right shoulder pain 04/05/2017   Cubital tunnel syndrome on right 01/24/2017   Paresthesias in right hand 01/02/2017   Shoulder pain 08/22/2016   Pure hypercholesterolemia 03/06/2016   Strain of rotator cuff capsule 01/30/2016   Lateral epicondylitis 01/30/2016   Blood pressure elevated 02/11/2015   GERD without esophagitis 02/11/2015   Past Medical History:  Diagnosis Date  GERD (gastroesophageal reflux disease)    mild-no meds used    History reviewed. No pertinent family history.  Past Surgical History:  Procedure Laterality Date   KNEE ARTHROSCOPY  1995   MOUTH SURGERY  1999   SHOULDER ARTHROSCOPY Left 2013   SHOULDER ARTHROSCOPY Right 05/28/2017   Procedure: RIGHT SHOULDER ARTHROSCOPY, SUBACROMIAL DECOMPRESSION, DISTAL CLAVICLE RESECTION, POSSIBLE MINI OPEN ROTATOR CUFF REPAIR, [facility] PATCH;  Surgeon: Valeria Batman, MD;  Location: [facility] OR;  Service: Orthopedics;  Laterality: Right;   ULNAR NERVE TRANSPOSITION Right 05/28/2017   Procedure: ULNAR NERVE DECOMPRESSION AT RIGHT ELBOW;  Surgeon: Valeria Batman, MD;  Location: [facility] OR;  Service: Orthopedics;  Laterality: Right;   Social History   Occupational History   Not on file  Tobacco Use   Smoking status: Former    Types: Cigarettes    Quit date: 1999    Years since quitting: 24.8   Smokeless tobacco: Never  Vaping Use   Vaping Use: Never used  Substance and Sexual Activity   Alcohol use: Not on file    Comment: rare   Drug use: No   Sexual activity: Yes

## 2022-03-01 ENCOUNTER — Ambulatory Visit (INDEPENDENT_AMBULATORY_CARE_PROVIDER_SITE_OTHER): Payer: BC Managed Care – PPO | Admitting: Rehabilitative and Restorative Service Providers"

## 2022-03-01 ENCOUNTER — Encounter: Payer: Self-pay | Admitting: Rehabilitative and Restorative Service Providers"

## 2022-03-01 DIAGNOSIS — R600 Localized edema: Secondary | ICD-10-CM | POA: Diagnosis not present

## 2022-03-01 DIAGNOSIS — M6281 Muscle weakness (generalized): Secondary | ICD-10-CM

## 2022-03-01 DIAGNOSIS — M25522 Pain in left elbow: Secondary | ICD-10-CM | POA: Diagnosis not present

## 2022-03-01 DIAGNOSIS — M25632 Stiffness of left wrist, not elsewhere classified: Secondary | ICD-10-CM | POA: Diagnosis not present

## 2022-03-01 DIAGNOSIS — M25622 Stiffness of left elbow, not elsewhere classified: Secondary | ICD-10-CM

## 2022-03-01 DIAGNOSIS — R202 Paresthesia of skin: Secondary | ICD-10-CM

## 2022-03-01 DIAGNOSIS — M25512 Pain in left shoulder: Secondary | ICD-10-CM

## 2022-03-06 ENCOUNTER — Encounter: Payer: Self-pay | Admitting: Rehabilitative and Restorative Service Providers"

## 2022-03-06 ENCOUNTER — Ambulatory Visit (INDEPENDENT_AMBULATORY_CARE_PROVIDER_SITE_OTHER): Payer: BC Managed Care – PPO | Admitting: Rehabilitative and Restorative Service Providers"

## 2022-03-06 DIAGNOSIS — R202 Paresthesia of skin: Secondary | ICD-10-CM

## 2022-03-06 DIAGNOSIS — G8929 Other chronic pain: Secondary | ICD-10-CM

## 2022-03-06 DIAGNOSIS — R278 Other lack of coordination: Secondary | ICD-10-CM

## 2022-03-06 DIAGNOSIS — M25632 Stiffness of left wrist, not elsewhere classified: Secondary | ICD-10-CM | POA: Diagnosis not present

## 2022-03-06 DIAGNOSIS — M25522 Pain in left elbow: Secondary | ICD-10-CM

## 2022-03-06 DIAGNOSIS — R600 Localized edema: Secondary | ICD-10-CM | POA: Diagnosis not present

## 2022-03-06 DIAGNOSIS — M25512 Pain in left shoulder: Secondary | ICD-10-CM

## 2022-03-06 DIAGNOSIS — M6281 Muscle weakness (generalized): Secondary | ICD-10-CM

## 2022-03-06 DIAGNOSIS — M25612 Stiffness of left shoulder, not elsewhere classified: Secondary | ICD-10-CM

## 2022-03-06 DIAGNOSIS — M25622 Stiffness of left elbow, not elsewhere classified: Secondary | ICD-10-CM
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
## 2022-03-08 ENCOUNTER — Encounter: Payer: Self-pay | Admitting: Rehabilitative and Restorative Service Providers"

## 2022-03-08 ENCOUNTER — Ambulatory Visit (INDEPENDENT_AMBULATORY_CARE_PROVIDER_SITE_OTHER): Payer: BC Managed Care – PPO | Admitting: Rehabilitative and Restorative Service Providers"

## 2022-03-08 DIAGNOSIS — M25512 Pain in left shoulder: Secondary | ICD-10-CM

## 2022-03-08 DIAGNOSIS — M25632 Stiffness of left wrist, not elsewhere classified: Secondary | ICD-10-CM | POA: Diagnosis not present

## 2022-03-08 DIAGNOSIS — R600 Localized edema: Secondary | ICD-10-CM

## 2022-03-08 DIAGNOSIS — R202 Paresthesia of skin: Secondary | ICD-10-CM

## 2022-03-08 DIAGNOSIS — M6281 Muscle weakness (generalized): Secondary | ICD-10-CM

## 2022-03-08 DIAGNOSIS — M25622 Stiffness of left elbow, not elsewhere classified: Secondary | ICD-10-CM

## 2022-03-08 DIAGNOSIS — R278 Other lack of coordination: Secondary | ICD-10-CM

## 2022-03-08 DIAGNOSIS — M25522 Pain in left elbow: Secondary | ICD-10-CM

## 2022-03-08 DIAGNOSIS — G8929 Other chronic pain: Secondary | ICD-10-CM

## 2022-03-08 DIAGNOSIS — M25612 Stiffness of left shoulder, not elsewhere classified: Secondary | ICD-10-CM
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
## 2022-03-13 ENCOUNTER — Ambulatory Visit (INDEPENDENT_AMBULATORY_CARE_PROVIDER_SITE_OTHER): Payer: BC Managed Care – PPO | Admitting: Rehabilitative and Restorative Service Providers"

## 2022-03-13 ENCOUNTER — Encounter: Payer: Self-pay | Admitting: Rehabilitative and Restorative Service Providers"

## 2022-03-13 DIAGNOSIS — M25612 Stiffness of left shoulder, not elsewhere classified: Secondary | ICD-10-CM | POA: Diagnosis not present

## 2022-03-13 DIAGNOSIS — M25512 Pain in left shoulder: Secondary | ICD-10-CM

## 2022-03-13 DIAGNOSIS — M25522 Pain in left elbow: Secondary | ICD-10-CM

## 2022-03-13 DIAGNOSIS — M6281 Muscle weakness (generalized): Secondary | ICD-10-CM

## 2022-03-13 DIAGNOSIS — R600 Localized edema: Secondary | ICD-10-CM | POA: Diagnosis not present

## 2022-03-13 DIAGNOSIS — R202 Paresthesia of skin: Secondary | ICD-10-CM

## 2022-03-13 DIAGNOSIS — R278 Other lack of coordination: Secondary | ICD-10-CM

## 2022-03-13 DIAGNOSIS — M25622 Stiffness of left elbow, not elsewhere classified: Secondary | ICD-10-CM

## 2022-03-13 DIAGNOSIS — M25632 Stiffness of left wrist, not elsewhere classified: Secondary | ICD-10-CM

## 2022-03-13 DIAGNOSIS — G8929 Other chronic pain: Secondary | ICD-10-CM
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
## 2022-03-15 ENCOUNTER — Encounter: Payer: Self-pay | Admitting: Rehabilitative and Restorative Service Providers"

## 2022-03-15 ENCOUNTER — Ambulatory Visit (INDEPENDENT_AMBULATORY_CARE_PROVIDER_SITE_OTHER): Payer: BC Managed Care – PPO | Admitting: Rehabilitative and Restorative Service Providers"

## 2022-03-15 DIAGNOSIS — G8929 Other chronic pain: Secondary | ICD-10-CM

## 2022-03-15 DIAGNOSIS — M25522 Pain in left elbow: Secondary | ICD-10-CM

## 2022-03-15 DIAGNOSIS — M25512 Pain in left shoulder: Secondary | ICD-10-CM

## 2022-03-15 DIAGNOSIS — R202 Paresthesia of skin: Secondary | ICD-10-CM

## 2022-03-15 DIAGNOSIS — M25612 Stiffness of left shoulder, not elsewhere classified: Secondary | ICD-10-CM

## 2022-03-15 DIAGNOSIS — R600 Localized edema: Secondary | ICD-10-CM

## 2022-03-15 DIAGNOSIS — R278 Other lack of coordination: Secondary | ICD-10-CM

## 2022-03-15 DIAGNOSIS — M25622 Stiffness of left elbow, not elsewhere classified: Secondary | ICD-10-CM

## 2022-03-15 DIAGNOSIS — M25632 Stiffness of left wrist, not elsewhere classified: Secondary | ICD-10-CM

## 2022-03-15 DIAGNOSIS — M6281 Muscle weakness (generalized): Secondary | ICD-10-CM

## 2022-03-20 ENCOUNTER — Ambulatory Visit (INDEPENDENT_AMBULATORY_CARE_PROVIDER_SITE_OTHER): Payer: BC Managed Care – PPO | Admitting: Rehabilitative and Restorative Service Providers"

## 2022-03-20 ENCOUNTER — Encounter: Payer: Self-pay | Admitting: Rehabilitative and Restorative Service Providers"

## 2022-03-20 DIAGNOSIS — R600 Localized edema: Secondary | ICD-10-CM | POA: Diagnosis not present

## 2022-03-20 DIAGNOSIS — R278 Other lack of coordination: Secondary | ICD-10-CM

## 2022-03-20 DIAGNOSIS — G8929 Other chronic pain: Secondary | ICD-10-CM

## 2022-03-20 DIAGNOSIS — M25632 Stiffness of left wrist, not elsewhere classified: Secondary | ICD-10-CM

## 2022-03-20 DIAGNOSIS — M6281 Muscle weakness (generalized): Secondary | ICD-10-CM

## 2022-03-20 DIAGNOSIS — M25522 Pain in left elbow: Secondary | ICD-10-CM | POA: Diagnosis not present

## 2022-03-20 DIAGNOSIS — M25512 Pain in left shoulder: Secondary | ICD-10-CM | POA: Diagnosis not present

## 2022-03-20 DIAGNOSIS — M25612 Stiffness of left shoulder, not elsewhere classified: Secondary | ICD-10-CM

## 2022-03-20 DIAGNOSIS — M25622 Stiffness of left elbow, not elsewhere classified: Secondary | ICD-10-CM

## 2022-03-20 DIAGNOSIS — R202 Paresthesia of skin: Secondary | ICD-10-CM
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
## 2022-03-22 ENCOUNTER — Telehealth: Payer: Self-pay | Admitting: Orthopaedic Surgery

## 2022-03-22 NOTE — Telephone Encounter (Signed)
Received call from patient. He stated [employer] told him they've been trying to get his medical records. I advised patient we haven't received anything from [employer]. I gave him both fax numbers and he will contact [employer] to have them fax [place] what they need.

## 2022-03-26 ENCOUNTER — Encounter: Payer: Self-pay | Admitting: Rehabilitative and Restorative Service Providers"

## 2022-03-26 ENCOUNTER — Ambulatory Visit (INDEPENDENT_AMBULATORY_CARE_PROVIDER_SITE_OTHER): Payer: BC Managed Care – PPO | Admitting: Rehabilitative and Restorative Service Providers"

## 2022-03-26 DIAGNOSIS — R202 Paresthesia of skin: Secondary | ICD-10-CM

## 2022-03-26 DIAGNOSIS — M25522 Pain in left elbow: Secondary | ICD-10-CM | POA: Diagnosis not present

## 2022-03-26 DIAGNOSIS — R278 Other lack of coordination: Secondary | ICD-10-CM

## 2022-03-26 DIAGNOSIS — M25512 Pain in left shoulder: Secondary | ICD-10-CM | POA: Diagnosis not present

## 2022-03-26 DIAGNOSIS — R600 Localized edema: Secondary | ICD-10-CM | POA: Diagnosis not present

## 2022-03-26 DIAGNOSIS — M25622 Stiffness of left elbow, not elsewhere classified: Secondary | ICD-10-CM

## 2022-03-26 DIAGNOSIS — M6281 Muscle weakness (generalized): Secondary | ICD-10-CM | POA: Diagnosis not present

## 2022-03-26 DIAGNOSIS — M25632 Stiffness of left wrist, not elsewhere classified: Secondary | ICD-10-CM

## 2022-03-26 DIAGNOSIS — G8929 Other chronic pain: Secondary | ICD-10-CM

## 2022-03-26 DIAGNOSIS — M25612 Stiffness of left shoulder, not elsewhere classified: Secondary | ICD-10-CM
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
## 2022-04-03 ENCOUNTER — Encounter: Payer: Self-pay | Admitting: Rehabilitative and Restorative Service Providers"

## 2022-04-03 ENCOUNTER — Ambulatory Visit (INDEPENDENT_AMBULATORY_CARE_PROVIDER_SITE_OTHER): Payer: BC Managed Care – PPO | Admitting: Rehabilitative and Restorative Service Providers"

## 2022-04-03 DIAGNOSIS — G8929 Other chronic pain: Secondary | ICD-10-CM

## 2022-04-03 DIAGNOSIS — R600 Localized edema: Secondary | ICD-10-CM

## 2022-04-03 DIAGNOSIS — M6281 Muscle weakness (generalized): Secondary | ICD-10-CM

## 2022-04-03 DIAGNOSIS — M25522 Pain in left elbow: Secondary | ICD-10-CM

## 2022-04-03 DIAGNOSIS — M25612 Stiffness of left shoulder, not elsewhere classified: Secondary | ICD-10-CM | POA: Diagnosis not present

## 2022-04-03 DIAGNOSIS — M25622 Stiffness of left elbow, not elsewhere classified: Secondary | ICD-10-CM

## 2022-04-03 DIAGNOSIS — R278 Other lack of coordination: Secondary | ICD-10-CM

## 2022-04-03 DIAGNOSIS — M25632 Stiffness of left wrist, not elsewhere classified: Secondary | ICD-10-CM

## 2022-04-03 DIAGNOSIS — M25512 Pain in left shoulder: Secondary | ICD-10-CM

## 2022-04-03 DIAGNOSIS — R202 Paresthesia of skin: Secondary | ICD-10-CM

## 2022-04-04 ENCOUNTER — Ambulatory Visit (INDEPENDENT_AMBULATORY_CARE_PROVIDER_SITE_OTHER): Payer: BC Managed Care – PPO | Admitting: Orthopaedic Surgery

## 2022-04-04 ENCOUNTER — Encounter: Payer: Self-pay | Admitting: Orthopaedic Surgery

## 2022-04-04 DIAGNOSIS — G5622 Lesion of ulnar nerve, left upper limb: Secondary | ICD-10-CM

## 2022-04-04 DIAGNOSIS — M7542 Impingement syndrome of left shoulder: Secondary | ICD-10-CM

## 2022-04-04 DIAGNOSIS — M7712 Lateral epicondylitis, left elbow: Secondary | ICD-10-CM

## 2022-04-04 NOTE — Progress Notes (Signed)
Office Visit Note   Patient: Jason Love           Date of Birth: 05-Aug-1975           MRN: [id number] Visit Date: 04/04/2022              Requested by: Kandyce Rud, MD [address] [facility] [place],  [place] [address] PCP: Kandyce Rud, MD   Assessment & Plan: Visit Diagnoses:  1. 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 Impingement syndrome of left shoulder   3. Lateral epicondylitis of left elbow     Plan: Mr. Cardarelli is approximately 9 weeks status post left shoulder arthroscopic SA D DCR, cubital tunnel release left elbow and release of lateral epicondylitis left elbow.  Said on a daily level his pain is about a 2 sometimes into the be a 6 or 7 after therapy but he feels like he is definitely making progress.  He continues to be followed by the [facility].  He definitely is better than he was before surgery.  He actually had reasonable range of motion he does have some stiffness.  I will have him continue with physical therapy and check him back in a month.  He continues to remain out of work.  He has not yet reached maximal medical improvement.  I would expect him to have about 15% permanent partial impairment to the left upper extremity.  We will determine his back to work status with when he returns in a month.  Follow-Up Instructions: Return in about 1 month (around 05/04/2022).   Orders:  No orders of the defined types were placed in this encounter.  No orders of the defined types were placed in this encounter.     Procedures: No procedures performed   Clinical Data: No additional findings.   Subjective: Chief Complaint  Patient presents with   Left Shoulder - Pain, Follow-up   Left Elbow - Pain, Follow-up  9 weeks status post left shoulder surgery including an arthroscopic SA D and DCR left elbow surgery including cubital tunnel release and tennis elbow release.  Actually doing well  and not having the pain that he had preoperatively.  Still having some soreness and stiffness and continues to be followed in physical therapy.  Pain level is usually at about a 2 and increases after therapy.  Overall doing well.  HPI  Review of Systems   Objective: Vital Signs: There were no vitals taken for this visit.  Physical Exam  Ortho Exam awake alert and oriented x3.  Comfortable sitting.  No acute distress.  I can fully overhead to place his right arm quickly in flexion.  He was able to do the same actively but more slowly.  Abduction was a little bit more of an issue but I could get to 90 degrees.  Minimally positive impingement testing.  Good grip and release.  Does not have the numbness or tingling into the ulnar 2 digits that he had preoperatively and had full range of motion of his elbow with pronation supination flexion and extension.  Incisions of healed without a problem  Specialty Comments:  No specialty comments available.  Imaging: No results found.   PMFS History: Patient Active Problem List   Diagnosis Date Noted   Cubital 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 02/01/2022   Impingement syndrome of left shoulder 10/05/2021   Pain in right knee 08/16/2021   Impingement syndrome  of right shoulder 05/28/2017   Chronic right shoulder pain 04/05/2017   Cubital tunnel syndrome on right 01/24/2017   Paresthesias in right hand 01/02/2017   Shoulder pain 08/22/2016   Pure hypercholesterolemia 03/06/2016   Strain of rotator cuff capsule 01/30/2016   Lateral epicondylitis 01/30/2016   Blood pressure elevated 02/11/2015   GERD without esophagitis 02/11/2015   Past Medical History:  Diagnosis Date   GERD (gastroesophageal reflux disease)    mild-no meds used    History reviewed. No pertinent family history.  Past Surgical History:  Procedure Laterality Date   KNEE ARTHROSCOPY  1995   MOUTH SURGERY  1999   SHOULDER ARTHROSCOPY Left 2013   SHOULDER ARTHROSCOPY Right 05/28/2017    Procedure: RIGHT SHOULDER ARTHROSCOPY, SUBACROMIAL DECOMPRESSION, DISTAL CLAVICLE RESECTION, POSSIBLE MINI OPEN ROTATOR CUFF REPAIR, [facility] PATCH;  Surgeon: Valeria Batman, MD;  Location: [facility];  Service: Orthopedics;  Laterality: Right;   ULNAR NERVE TRANSPOSITION Right 05/28/2017   Procedure: ULNAR NERVE DECOMPRESSION AT RIGHT ELBOW;  Surgeon: Valeria Batman, MD;  Location: [facility] OR;  Service: Orthopedics;  Laterality: Right;   Social History   Occupational History   Not on file  Tobacco Use   Smoking status: Former    Types: Cigarettes    Quit date: 1999    Years since quitting: 24.9   Smokeless tobacco: Never  Vaping Use   Vaping Use: Never used  Substance and Sexual Activity   Alcohol use: Not on file    Comment: rare   Drug use: No   Sexual activity: Yes     Valeria Batman, MD   Note - This record has been created using Animal nutritionist.  Chart creation errors have been sought, but may not always  have been located. Such creation errors do not reflect on  the standard of medical care.
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
## 2022-04-12 ENCOUNTER — Encounter: Payer: Self-pay | Admitting: Rehabilitative and Restorative Service Providers"

## 2022-04-12 ENCOUNTER — Ambulatory Visit (INDEPENDENT_AMBULATORY_CARE_PROVIDER_SITE_OTHER): Payer: BC Managed Care – PPO | Admitting: Rehabilitative and Restorative Service Providers"

## 2022-04-12 DIAGNOSIS — M25512 Pain in left shoulder: Secondary | ICD-10-CM

## 2022-04-12 DIAGNOSIS — M25612 Stiffness of left shoulder, not elsewhere classified: Secondary | ICD-10-CM

## 2022-04-12 DIAGNOSIS — R600 Localized edema: Secondary | ICD-10-CM

## 2022-04-12 DIAGNOSIS — M6281 Muscle weakness (generalized): Secondary | ICD-10-CM

## 2022-04-12 DIAGNOSIS — R278 Other lack of coordination: Secondary | ICD-10-CM
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
## 2022-04-19 ENCOUNTER — Ambulatory Visit (INDEPENDENT_AMBULATORY_CARE_PROVIDER_SITE_OTHER): Payer: BC Managed Care – PPO | Admitting: Rehabilitative and Restorative Service Providers"

## 2022-04-19 ENCOUNTER — Encounter: Payer: Self-pay | Admitting: Rehabilitative and Restorative Service Providers"

## 2022-04-19 DIAGNOSIS — G8929 Other chronic pain: Secondary | ICD-10-CM

## 2022-04-19 DIAGNOSIS — M6281 Muscle weakness (generalized): Secondary | ICD-10-CM

## 2022-04-19 DIAGNOSIS — M25522 Pain in left elbow: Secondary | ICD-10-CM | POA: Diagnosis not present

## 2022-04-19 DIAGNOSIS — M25512 Pain in left shoulder: Secondary | ICD-10-CM

## 2022-04-19 DIAGNOSIS — M25632 Stiffness of left wrist, not elsewhere classified: Secondary | ICD-10-CM | POA: Diagnosis not present

## 2022-04-19 DIAGNOSIS — M25612 Stiffness of left shoulder, not elsewhere classified: Secondary | ICD-10-CM | POA: Diagnosis not present

## 2022-04-19 DIAGNOSIS — R202 Paresthesia of skin: Secondary | ICD-10-CM

## 2022-04-19 DIAGNOSIS — M25622 Stiffness of left elbow, not elsewhere classified: Secondary | ICD-10-CM

## 2022-04-19 DIAGNOSIS — R278 Other lack of coordination: Secondary | ICD-10-CM

## 2022-04-20 ENCOUNTER — Telehealth: Payer: Self-pay | Admitting: Orthopaedic Surgery

## 2022-04-20 NOTE — Telephone Encounter (Signed)
Patient requesting extension on leave. Please advise if okay and how long. Thank you
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
## 2022-04-24 ENCOUNTER — Ambulatory Visit (INDEPENDENT_AMBULATORY_CARE_PROVIDER_SITE_OTHER): Payer: BC Managed Care – PPO | Admitting: Rehabilitative and Restorative Service Providers"

## 2022-04-24 ENCOUNTER — Encounter: Payer: Self-pay | Admitting: Rehabilitative and Restorative Service Providers"

## 2022-04-24 DIAGNOSIS — M25612 Stiffness of left shoulder, not elsewhere classified: Secondary | ICD-10-CM | POA: Diagnosis not present

## 2022-04-24 DIAGNOSIS — M25522 Pain in left elbow: Secondary | ICD-10-CM

## 2022-04-24 DIAGNOSIS — R278 Other lack of coordination: Secondary | ICD-10-CM

## 2022-04-24 DIAGNOSIS — M25632 Stiffness of left wrist, not elsewhere classified: Secondary | ICD-10-CM

## 2022-04-24 DIAGNOSIS — M25512 Pain in left shoulder: Secondary | ICD-10-CM

## 2022-04-24 DIAGNOSIS — M25622 Stiffness of left elbow, not elsewhere classified: Secondary | ICD-10-CM

## 2022-04-24 DIAGNOSIS — G8929 Other chronic pain: Secondary | ICD-10-CM

## 2022-04-24 DIAGNOSIS — R600 Localized edema: Secondary | ICD-10-CM

## 2022-04-24 DIAGNOSIS — M6281 Muscle weakness (generalized): Secondary | ICD-10-CM

## 2022-04-24 DIAGNOSIS — R202 Paresthesia of skin: Secondary | ICD-10-CM

## 2022-04-25 ENCOUNTER — Telehealth: Payer: Self-pay | Admitting: Orthopaedic Surgery

## 2022-04-25 NOTE — Telephone Encounter (Signed)
[employer] forms received to be addressed at rov. To [employer].

## 2022-04-25 NOTE — Telephone Encounter (Signed)
Noted for [employer] ?

## 2022-05-02 ENCOUNTER — Ambulatory Visit (INDEPENDENT_AMBULATORY_CARE_PROVIDER_SITE_OTHER): Payer: BC Managed Care – PPO | Admitting: Orthopaedic Surgery

## 2022-05-02 ENCOUNTER — Encounter: Payer: Self-pay | Admitting: Orthopaedic Surgery

## 2022-05-02 DIAGNOSIS — M7542 Impingement syndrome of left shoulder: Secondary | ICD-10-CM

## 2022-05-02 DIAGNOSIS — M7712 Lateral epicondylitis, left elbow: Secondary | ICD-10-CM

## 2022-05-02 DIAGNOSIS — G5622 Lesion of ulnar nerve, left upper limb: Secondary | ICD-10-CM | POA: Diagnosis not present
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
## 2022-05-03 ENCOUNTER — Encounter: Payer: Self-pay | Admitting: Rehabilitative and Restorative Service Providers"

## 2022-05-03 ENCOUNTER — Ambulatory Visit (INDEPENDENT_AMBULATORY_CARE_PROVIDER_SITE_OTHER): Payer: BC Managed Care – PPO | Admitting: Rehabilitative and Restorative Service Providers"

## 2022-05-03 DIAGNOSIS — R600 Localized edema: Secondary | ICD-10-CM

## 2022-05-03 DIAGNOSIS — M25612 Stiffness of left shoulder, not elsewhere classified: Secondary | ICD-10-CM

## 2022-05-03 DIAGNOSIS — M25512 Pain in left shoulder: Secondary | ICD-10-CM

## 2022-05-03 DIAGNOSIS — M25622 Stiffness of left elbow, not elsewhere classified: Secondary | ICD-10-CM

## 2022-05-03 DIAGNOSIS — M25522 Pain in left elbow: Secondary | ICD-10-CM

## 2022-05-03 DIAGNOSIS — M6281 Muscle weakness (generalized): Secondary | ICD-10-CM

## 2022-05-03 DIAGNOSIS — R202 Paresthesia of skin: Secondary | ICD-10-CM

## 2022-05-03 DIAGNOSIS — M25632 Stiffness of left wrist, not elsewhere classified: Secondary | ICD-10-CM

## 2022-05-03 DIAGNOSIS — G8929 Other chronic pain: Secondary | ICD-10-CM

## 2022-05-03 DIAGNOSIS — R278 Other lack of coordination: Secondary | ICD-10-CM
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
## 2022-05-10 ENCOUNTER — Encounter: Payer: Self-pay | Admitting: Rehabilitative and Restorative Service Providers"

## 2022-05-10 ENCOUNTER — Ambulatory Visit (INDEPENDENT_AMBULATORY_CARE_PROVIDER_SITE_OTHER): Payer: BC Managed Care – PPO | Admitting: Rehabilitative and Restorative Service Providers"

## 2022-05-10 DIAGNOSIS — M6281 Muscle weakness (generalized): Secondary | ICD-10-CM

## 2022-05-10 DIAGNOSIS — M25622 Stiffness of left elbow, not elsewhere classified: Secondary | ICD-10-CM

## 2022-05-10 DIAGNOSIS — M25612 Stiffness of left shoulder, not elsewhere classified: Secondary | ICD-10-CM | POA: Diagnosis not present

## 2022-05-10 DIAGNOSIS — M25522 Pain in left elbow: Secondary | ICD-10-CM | POA: Diagnosis not present

## 2022-05-10 DIAGNOSIS — R600 Localized edema: Secondary | ICD-10-CM

## 2022-05-10 DIAGNOSIS — R202 Paresthesia of skin: Secondary | ICD-10-CM | POA: Diagnosis not present

## 2022-05-10 DIAGNOSIS — M25512 Pain in left shoulder: Secondary | ICD-10-CM

## 2022-05-10 DIAGNOSIS — M25632 Stiffness of left wrist, not elsewhere classified: Secondary | ICD-10-CM

## 2022-05-10 DIAGNOSIS — G8929 Other chronic pain: Secondary | ICD-10-CM

## 2022-05-10 DIAGNOSIS — R278 Other lack of coordination: Secondary | ICD-10-CM
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
## 2022-05-17 ENCOUNTER — Ambulatory Visit (INDEPENDENT_AMBULATORY_CARE_PROVIDER_SITE_OTHER): Payer: BC Managed Care – PPO | Admitting: Rehabilitative and Restorative Service Providers"

## 2022-05-17 DIAGNOSIS — M25612 Stiffness of left shoulder, not elsewhere classified: Secondary | ICD-10-CM | POA: Diagnosis not present

## 2022-05-17 DIAGNOSIS — M6281 Muscle weakness (generalized): Secondary | ICD-10-CM

## 2022-05-17 DIAGNOSIS — G8929 Other chronic pain: Secondary | ICD-10-CM

## 2022-05-17 DIAGNOSIS — M25512 Pain in left shoulder: Secondary | ICD-10-CM

## 2022-05-17 DIAGNOSIS — R278 Other lack of coordination: Secondary | ICD-10-CM | POA: Diagnosis not present

## 2022-05-17 DIAGNOSIS — M25522 Pain in left elbow: Secondary | ICD-10-CM

## 2022-05-23 NOTE — Therapy (Signed)
OUTPATIENT OCCUPATIONAL THERAPY TREATMENT NOTE  Patient Name: Jason Love MRN: [id number] DOB:Feb 16, 1976, 47 y.o., male Today's Date: 05/24/2022  PCP: Derinda Late, MD REFERRING PROVIDER: Garald Balding, MD      OT End of Session - 05/24/22 1015     Visit Number 17    Number of Visits 20    Date for OT Re-Evaluation 06/15/22    Authorization Type [employer] - 27 visits remain    Authorization - Number of Visits 30    OT Start Time 0943    OT Stop Time 1021    OT Time Calculation (min) 38 min    Activity Tolerance Patient tolerated treatment well;Patient limited by fatigue;No increased pain    Behavior During Therapy WFL for tasks assessed/performed                Past Medical History:  Diagnosis Date   GERD (gastroesophageal reflux disease)    mild-no meds used   Past Surgical History:  Procedure Laterality Date   KNEE ARTHROSCOPY  [place] ARTHROSCOPY Left 2013   SHOULDER ARTHROSCOPY Right 05/28/2017   Procedure: RIGHT SHOULDER ARTHROSCOPY, SUBACROMIAL DECOMPRESSION, DISTAL CLAVICLE RESECTION, POSSIBLE MINI OPEN ROTATOR CUFF REPAIR, [facility] PATCH;  Surgeon: Garald Balding, MD;  Location: [place];  Service: Orthopedics;  Laterality: Right;   ULNAR NERVE TRANSPOSITION Right 05/28/2017   Procedure: ULNAR NERVE DECOMPRESSION AT RIGHT ELBOW;  Surgeon: Garald Balding, MD;  Location: [place];  Service: Orthopedics;  Laterality: Right;   Patient Active Problem List   Diagnosis Date Noted   Cubital 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 02/01/2022   Impingement syndrome of left shoulder 10/05/2021   Pain in right knee 08/16/2021   Impingement syndrome of right shoulder 05/28/2017   Chronic right shoulder pain 04/05/2017   Cubital tunnel syndrome on right 01/24/2017   Paresthesias in right hand 01/02/2017   Shoulder pain 08/22/2016   Pure hypercholesterolemia 03/06/2016   Strain of rotator cuff capsule 01/30/2016   Lateral epicondylitis  01/30/2016   Blood pressure elevated 02/11/2015   GERD without esophagitis 02/11/2015    ONSET DATE: DOS 01/25/22  REFERRING DIAG:  M75.42 (ICD-10-CM) - Impingement syndrome of left shoulder  G56.22 (ICD-10-CM) - Cubital 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    THERAPY DIAG:  Pain in left elbow  Stiffness of left shoulder, not elsewhere classified  Muscle weakness (generalized)  Rationale for Evaluation and Treatment Rehabilitation  PRECAUTIONS: None now   SUBJECTIVE: (All objective assessments below are from initial evaluation unless otherwise specified.)   SUBJECTIVE STATEMENT: He states doing well since last seen, continues to work on [employer] and progressive resistance at home.  He asks if he can start using free weights and OT encourages him to at this point.  PAIN:  Are you having pain?  No, just mildly  tight Rating: 0/10 at rest now   PATIENT GOALS To get Lt arm stronger, full motion and least pain as possible   OBJECTIVE: (All objective assessments below are from initial evaluation on: 02/20/22 unless otherwise specified.)    HAND DOMINANCE: Right   ADLs: Overall ADLs: 05/17/22: Basic ADLs no problems now, higher level tasks all improving as well.  Remaining difficulties and concerns are with work tasks that involve grasping and lifting objects that could be up to 100 pounds which still bothers his elbow and shoulder.   FUNCTIONAL OUTCOME MEASURES: 05/17/22: PSFS 6.8 Today  Eval: Patient Specific Functional Scale: 0.6 (catch a  baseball/football, reach to wash back, lift/flip a cabinet)  (Higher Score  =  Better Ability for the Selected Tasks)     (Baseline was scored 3.3 at eval on 10/12/21)   UPPER EXTREMITY ROM     Shoulder to Wrist AROM Left Eval 02/20/22 Lt 03/13/22 Lt 04/03/22 Lt 04/19/22 Lt 05/17/22  Shoulder flexion 74 (AROM was 142* on 10/26/21)  142* AROM 121* 152 151  Shoulder abduction 62 (Was 127* on 10/26/21) 145* AROM 95* 116 135  Shoulder  internal rotation Touches belly arm adducted (Was 20* on 10/26/21) 15* AROM  27* 25 53  Shoulder external rotation 31 (Was 54* on 10/26/21) 90* AROM 67* 61 67  Elbow flexion 94 (Was full motion on 10/12/21) 134*  (132* opposite)  Full motion    Elbow extension (-18) (Was full motion on 10/12/21) (-5*)  Full motion    Forearm supination 58 (Was 48* on 10/26/21)  equal Full Motion    Forearm pronation  90 (Was 90* on 10/12/21)   Full motion    Wrist flexion 58 (Was 60* on 10/24/21)  Full motion    Wrist extension 67 (Was 67* on 10/24/21)   Full Motion now    (Blank rows = not tested)   UPPER EXTREMITY MMT:     MMT Left 04/03/22 Lt 05/17/22  Shoulder flexion 4-/5 MMT in somewhat limited ROM 5/5  Shoulder abduction 4-/5 MMT in somewhat limited ROM 4+/5  Shoulder adduction 4+/5 4+/5  Shoulder extension 4+/5 5/5  Shoulder internal rotation 4+/5 5/5  Shoulder external rotation 4/5  4+/5  Elbow flexion 4+/5 5/5  Elbow extension 4+/5 4+/5  Forearm supination 5/5 5/5  Forearm pronation 5/5 5/5  Wrist flexion 5/5 5/5  Wrist extension 4+/5 4+/5  (Blank rows = not tested)  HAND FUNCTION: 05/17/22: 100.7# tender to elbow (137# in Rt hand)   COORDINATION: 05/17/22: Lt 59 bocks today (60 is [facility]    TODAY'S TREATMENT:  05/24/22: He starts with UBE on 5.0 x6 mins for fnl endurance, he then performs the following resistive and dynamic stretches, exercises and activities showing much better functional tolerance and ability now.  He needs rest breaks due to being out of breath at times, which is somewhat positive sign that his muscular endurance is now starting to arrival his cardiovascular endurance.       Ball on wall sh abd stretches      Standing IR stretches "belt"       Seated row machine 45# x15; 55# x15; 65# x10      Standing ER with 5# x15; 10# x10       Standing Tricep press 45# x 15; 55# x15 for elbow strength        Box lift work simulation 18# x5; 35# x10    05/17/22: Pt performs  AROM, gripping, and strength with Lt shoulder, elbow, hand against therapist's resistance for exercise/activities as well as new measures today.  In addition he also reaches overhead throws a ball bounced as a ball and does a job stimulation activity of lifting desk that weighs about 20 pounds to a countertop.  Dynamic skills for reaching grasping and throwing are improving though still a bit stiff, and lifting the desk did still cause some discomfort or pain in the outside lateral aspect of the shoulder as well as the lateral epicondyle of the left arm.  OT also discusses home and functional tasks with the pt and reviews goals.  Fortunately home tasks are all independent  at this point, but higher level work tasks remain to be potentially hazardous to his healing body still. OT also reviews all of his home exercises and recommends to focus on endrange shoulder abduction internal rotation and external rotation stretching, add shoulder abduction strength as this was somewhat tender today, lateral epicondyle strengthening through stretching of the ECRB and strengthening and grip and wrist extension.  He states understanding these targeted issues.    PATIENT EDUCATION: Education details: See tx section above for details  Person educated: Patient Education method: Verbal Instruction, Teach back, Handouts  Education comprehension: States and demonstrates understanding, Additional Education required    HOME EXERCISE PROGRAM: Access Code: [email or web address] URL: [email or web address]  GOALS: Goals reviewed with patient? Yes   SHORT TERM GOALS: (STG required if POC>30 days) Target Date: 03/09/22  Pt will demo/state understanding of initial HEP to improve pain levels and prerequisite motion. Goal status: 03/01/22: MET   LONG TERM GOALS: Target Date: 05/18/22  Pt will improve functional ability by decreased impairment per PSFS assessment from 0.6 to 6 or better, for better quality of life. Goal  status: 05/17/22: MET- will be upgraded to 8 or better now   2.  Pt will improve grip strength in Lt hand from to at least 100lbs for functional use at home and in IADLs. (Comparing to baseline 90lbs with pain)  Goal status: 05/03/22: MET  3.  Pt will improve A/ROM in Lt shoulder flex and abduction from to at least 140* each, to have functional motion for tasks like reach and grasp.  Goal status: 05/17/22: Met in flexion (153*), but not in abduction yet (135*)  4.  Pt will improve strength in Lt elbow from 3-/5 MMT to at least 4+/5 MMT to have increased functional ability to carry out selfcare and higher-level homecare tasks with no difficulty. Goal status: 04/03/22: MET  5.  Pt will improve coordination skills in Lt arm, as seen by better score on Box and Blocks testing to at least 65 blocks, to have increased functional ability to carry out fine motor tasks (fasteners, etc.) and more complex, coordinated IADLs (meal prep, sports, etc.).  Goal status: 05/17/22: Today 59 blocks (WFL is at least 60)   6.  Pt will decrease pain at worst from 6-7/10 to 2/10 or better to have better sleep and occupational participation in daily roles. Goal status: 05/17/22: MET- none significant recently   7. Using compensatory devices and modifications (elbow strap/shoulder brace/wrist brace or work glove), patient will lift and move at least a 50 pound box with no hand or grip 15 times from floor to countertop without significant pain or pulling at the elbow or shoulder.  (Stimulation of work tasks)  Goal Status: 05/17/22 New Goal today   ASSESSMENT:  CLINICAL IMPRESSION: 05/24/22: Patient tolerating significantly more resistance today with little to no warm up required, no pain just tightness.  Continue plan of care  PLAN: OT FREQUENCY: 1 x week (will stick to 20 total visits per last progress note- he has 4 visits left)   OT DURATION: up to 4 additional weeks (through 06/15/22 as needed)   PLANNED  INTERVENTIONS: self care/ADL training, therapeutic exercise, therapeutic activity, neuromuscular re-education, manual therapy, scar mobilization, passive range of motion, splinting, electrical stimulation, ultrasound, fluidotherapy, compression bandaging, moist heat, cryotherapy, contrast bath, patient/family education, and coping strategies training  CONSULTED AND AGREED WITH PLAN OF CARE: Patient  PLAN FOR NEXT SESSION:  Continue with dynamic push pull progressive resistance exercises functional overhead throwing all  targeting shoulder abduction wrist and elbow extension and internal rotation.   Benito Mccreedy, OTR/L, CHT 05/24/2022, 10:37 AM

## 2022-05-24 ENCOUNTER — Encounter: Payer: Self-pay | Admitting: Rehabilitative and Restorative Service Providers"

## 2022-05-24 ENCOUNTER — Ambulatory Visit (INDEPENDENT_AMBULATORY_CARE_PROVIDER_SITE_OTHER): Payer: BC Managed Care – PPO | Admitting: Rehabilitative and Restorative Service Providers"

## 2022-05-24 DIAGNOSIS — M6281 Muscle weakness (generalized): Secondary | ICD-10-CM | POA: Diagnosis not present

## 2022-05-24 DIAGNOSIS — M25612 Stiffness of left shoulder, not elsewhere classified: Secondary | ICD-10-CM | POA: Diagnosis not present

## 2022-05-24 DIAGNOSIS — M25522 Pain in left elbow: Secondary | ICD-10-CM

## 2022-05-28 NOTE — Therapy (Signed)
OUTPATIENT OCCUPATIONAL THERAPY TREATMENT NOTE  Patient Name: Jason Love MRN: [id number] DOB:[birth year], 47 y.o., male Today's Date: 05/31/2022  PCP: Derinda Late, MD REFERRING PROVIDER: Garald Balding, MD      OT End of Session - 05/31/22 [id number]     Visit Number 18    Number of Visits 20    Date for OT Re-Evaluation 06/15/22    Authorization Type BC[employer] - 53 visits remain    Authorization - Number of Visits 24    OT Start Time 0807    OT Stop Time 0847    OT Time Calculation (min) 40 min    Activity Tolerance Patient tolerated treatment well;Patient limited by fatigue;Patient limited by pain;No increased pain    Behavior During Therapy WFL for tasks assessed/performed              Past Medical History:  Diagnosis Date   GERD (gastroesophageal reflux disease)    mild-no meds used   Past Surgical History:  Procedure Laterality Date   KNEE ARTHROSCOPY  [place] ARTHROSCOPY Left 2013   SHOULDER ARTHROSCOPY Right 05/28/2017   Procedure: RIGHT SHOULDER ARTHROSCOPY, SUBACROMIAL DECOMPRESSION, DISTAL CLAVICLE RESECTION, POSSIBLE MINI OPEN ROTATOR CUFF REPAIR, [facility] PATCH;  Surgeon: Garald Balding, MD;  Location: [place];  Service: Orthopedics;  Laterality: Right;   ULNAR NERVE TRANSPOSITION Right 05/28/2017   Procedure: ULNAR NERVE DECOMPRESSION AT RIGHT ELBOW;  Surgeon: Garald Balding, MD;  Location: [place];  Service: Orthopedics;  Laterality: Right;   Patient Active Problem List   Diagnosis Date Noted   Cubital 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 02/01/2022   Impingement syndrome of left shoulder 10/05/2021   Pain in right knee 08/16/2021   Impingement syndrome of right shoulder 05/28/2017   Chronic right shoulder pain 04/05/2017   Cubital tunnel syndrome on right 01/24/2017   Paresthesias in right hand 01/02/2017   Shoulder pain 08/22/2016   Pure hypercholesterolemia 03/06/2016   Strain of rotator cuff capsule 01/30/2016   Lateral  epicondylitis 01/30/2016   Blood pressure elevated 02/11/2015   GERD without esophagitis 02/11/2015    ONSET DATE: DOS 01/25/22  REFERRING DIAG:  M75.42 (ICD-10-CM) - Impingement syndrome of left shoulder  G56.22 (ICD-10-CM) - Cubital 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    THERAPY DIAG:  Stiffness of left shoulder, not elsewhere classified  Muscle weakness (generalized)  Other lack of coordination  Stiffness of left elbow, not elsewhere classified  Stiffness of left wrist, not elsewhere classified  Localized edema  Rationale for Evaluation and Treatment Rehabilitation  PRECAUTIONS: None now   SUBJECTIVE: (All objective assessments below are from initial evaluation unless otherwise specified.)   SUBJECTIVE STATEMENT: He states for ~5-6 days he's been very sore, even sharper pain under A/C J and in A/C J.  Also in Lt elbow but that's feeling a bit better now.  He is unsure what caused this, it could have been higher level exercises at last session.  He did rest and try to stretch and use modalities but he has been avoiding strengthening now which is appropriate.  PAIN:  Are you having pain? Yes  he states feeling sharp pain in A/C joint and under for about 5-6 days  Rating: 5/10 at rest now   PATIENT GOALS To get Lt arm stronger, full motion and least pain as possible   OBJECTIVE: (All objective assessments below are from initial evaluation on: 02/20/22 unless otherwise specified.)    HAND DOMINANCE: Right  ADLs: Overall ADLs: 05/17/22: Basic ADLs no problems now, higher level tasks all improving as well.  Remaining difficulties and concerns are with work tasks that involve grasping and lifting objects that could be up to 100 pounds which still bothers his elbow and shoulder.   FUNCTIONAL OUTCOME MEASURES: 05/17/22: PSFS 6.8 Today  Eval: Patient Specific Functional Scale: 0.6 (catch a baseball/football, reach to wash back, lift/flip a cabinet)  (Higher Score  =  Better Ability  for the Selected Tasks)     (Baseline was scored 3.3 at eval on 10/12/21)   UPPER EXTREMITY ROM     Shoulder to Wrist AROM Left Eval 02/20/22 Lt 04/03/22 Lt 05/17/22  Shoulder flexion 74 (AROM was 142* on 10/26/21)  121* 151  Shoulder abduction 62 (Was 127* on 10/26/21) 95* 135  Shoulder internal rotation Touches belly arm adducted (Was 20* on 10/26/21) 27* 53  Shoulder external rotation 31 (Was 62* on 10/26/21) 67* 67  Elbow flexion 94 (Was full motion on 10/12/21) Full motion   Elbow extension (-18) (Was full motion on 10/12/21) Full motion   Forearm supination 58 (Was 48* on 10/26/21)  Full Motion   Forearm pronation  90 (Was 90* on 10/12/21)  Full motion   Wrist flexion 58 (Was 60* on 10/24/21) Full motion   Wrist extension 67 (Was 67* on 10/24/21)  Full Motion now   (Blank rows = not tested)   UPPER EXTREMITY MMT:     MMT Left 04/03/22 Lt 05/17/22  Shoulder flexion 4-/5 MMT in somewhat limited ROM 5/5  Shoulder abduction 4-/5 MMT in somewhat limited ROM 4+/5  Shoulder adduction 4+/5 4+/5  Shoulder extension 4+/5 5/5  Shoulder internal rotation 4+/5 5/5  Shoulder external rotation 4/5  4+/5  Elbow flexion 4+/5 5/5  Elbow extension 4+/5 4+/5  Forearm supination 5/5 5/5  Forearm pronation 5/5 5/5  Wrist flexion 5/5 5/5  Wrist extension 4+/5 4+/5  (Blank rows = not tested)  HAND FUNCTION: 05/17/22: 100.7# tender to elbow (137# in Rt hand)   COORDINATION: 05/17/22: Lt 9 bocks today (60 is [facility])    TODAY'S TREATMENT:  05/31/22: Due to increase in pain which seems to be centered at the [facility] joint and subacromial space (this is chronic for him even despite having subacromial decompression) as well as lateral elbow, OT focuses on pain relief and regaining motion through manual therapy IASTM to the upper trap and supraspinatus as possible through the lateral deltoid, tricep along the elbow laterally and down the length of the forearm dorsally.  OT then does manual stretches for the  supraspinatus as well as the lateral elbow which she states are not painful.  Next he uses the UBE (UBE on 4.5 resistance x 5 mins) to help get tissues moving with gentle functional endurance activity.  This is nonpainful to him largely.  Lastly he does shoulder pulley stretches mainly in internal rotation and also abduction, with concurrent manual percussion therapy.  He states feeling much better at the end but still feeling somewhat sore under the subacromial space with abduction.  OT educates to continue to work on his home exercise program focusing on rest and getting more progressive back into strengthening over the course of this week.  If he feels the pain in subacromial space focus on supraspinatus stretches, and also avoid repetitive motions and pure abduction instead favoring scaption which was not painful for him today.  He states understanding.    05/24/22: He starts with UBE on 5.0 x6 mins  for fnl endurance, he then performs the following resistive and dynamic stretches, exercises and activities showing much better functional tolerance and ability now.  He needs rest breaks due to being out of breath at times, which is somewhat positive sign that his muscular endurance is now starting to arrival his cardiovascular endurance.       Ball on wall sh abd stretches      Standing IR stretches "belt"       Seated row machine 45# x15; 55# x15; 65# x10      Standing ER with 5# x15; 10# x10       Standing Tricep press 45# x 15; 55# x15 for elbow strength        Box lift work simulation 18# x5; 35# x10     PATIENT EDUCATION: Education details: See tx section above for details  Person educated: Patient Education method: Engineer, structural, Teach back, Handouts  Education comprehension: States and demonstrates understanding, Additional Education required    HOME EXERCISE PROGRAM: Access Code: [email or web address] URL: [email or web address]  GOALS: Goals reviewed with patient?  Yes   SHORT TERM GOALS: (STG required if POC>30 days) Target Date: 03/09/22  Pt will demo/state understanding of initial HEP to improve pain levels and prerequisite motion. Goal status: 03/01/22: MET   LONG TERM GOALS: Target Date: 06/15/22     Pt will improve functional ability by decreased impairment per PSFS assessment from 0.6 to 6 or better, for better quality of life. Goal status: 05/17/22: MET- will be upgraded to 8 or better now   2.  Pt will improve grip strength in Lt hand from to at least 100lbs for functional use at home and in IADLs. (Comparing to baseline 90lbs with pain)  Goal status: 05/03/22: MET  3.  Pt will improve A/ROM in Lt shoulder flex and abduction from to at least 140* each, to have functional motion for tasks like reach and grasp.  Goal status: 05/17/22: Met in flexion (153*), but not in abduction yet (135*)  4.  Pt will improve strength in Lt elbow from 3-/5 MMT to at least 4+/5 MMT to have increased functional ability to carry out selfcare and higher-level homecare tasks with no difficulty. Goal status: 04/03/22: MET  5.  Pt will improve coordination skills in Lt arm, as seen by better score on Box and Blocks testing to at least 65 blocks, to have increased functional ability to carry out fine motor tasks (fasteners, etc.) and more complex, coordinated IADLs (meal prep, sports, etc.).  Goal status: 05/17/22: Today 59 blocks (WFL is at least 60)   6.  Pt will decrease pain at worst from 6-7/10 to 2/10 or better to have better sleep and occupational participation in daily roles. Goal status: 05/17/22: MET- none significant recently   7. Using compensatory devices and modifications (elbow strap/shoulder brace/wrist brace or work glove), patient will lift and move at least a 50 pound box with no hand or grip 15 times from floor to countertop without significant pain or pulling at the elbow or shoulder.  (Stimulation of work tasks)  Goal Status: 05/17/22 New Goal  today   ASSESSMENT:  CLINICAL IMPRESSION: 05/31/22: Unfortunately he has had an exacerbation of pain which continues to be centered mainly in the subacromial space, despite subacromial decompression and careful therapy progression.  It seems that he is just prone to this especially in pure abduction and so he should be modifying his reaching technique to avoid pure abduction and be more in the scapular  plane.  He understands that this is a major "weakness" of his and needs to be dealt with over time carefully.  Continue plan of care and try to get back to stretching and strengthening more aggressively next week as he should be able to progress with his knowledge back to his new baselines.  PLAN: OT FREQUENCY: 1 x week (will stick to 20 total visits per last progress note- he has 4 visits left)   OT DURATION: up to 4 additional weeks (through 06/15/22 as needed)   PLANNED INTERVENTIONS: self care/ADL training, therapeutic exercise, therapeutic activity, neuromuscular re-education, manual therapy, scar mobilization, passive range of motion, splinting, electrical stimulation, ultrasound, fluidotherapy, compression bandaging, moist heat, cryotherapy, contrast bath, patient/family education, and coping strategies training  CONSULTED AND AGREED WITH PLAN OF CARE: Patient  PLAN FOR NEXT SESSION:  He only has 2 weeks per plan of care left.  Check to see that exacerbation of pain has resolved and that he has gotten back to strengthening program.  Continue, possibly less aggressively with functional activities and push pull etc. as tolerated next week.  Recommend neoprene shoulder brace that could cushion and support his shoulder for long-term use and management of chronic subacromial pains.   Fannie Knee, OTR/L, CHT 05/31/2022, 9:53 AM

## 2022-05-29 NOTE — Addendum Note (Signed)
Addended by: Benito Mccreedy D on: 05/29/2022 08:51 AM   Modules accepted: Orders

## 2022-05-31 ENCOUNTER — Encounter: Payer: Self-pay | Admitting: Rehabilitative and Restorative Service Providers"

## 2022-05-31 ENCOUNTER — Ambulatory Visit (INDEPENDENT_AMBULATORY_CARE_PROVIDER_SITE_OTHER): Payer: BC Managed Care – PPO | Admitting: Rehabilitative and Restorative Service Providers"

## 2022-05-31 DIAGNOSIS — M25622 Stiffness of left elbow, not elsewhere classified: Secondary | ICD-10-CM

## 2022-05-31 DIAGNOSIS — M25612 Stiffness of left shoulder, not elsewhere classified: Secondary | ICD-10-CM | POA: Diagnosis not present

## 2022-05-31 DIAGNOSIS — M6281 Muscle weakness (generalized): Secondary | ICD-10-CM

## 2022-05-31 DIAGNOSIS — R600 Localized edema: Secondary | ICD-10-CM

## 2022-05-31 DIAGNOSIS — R278 Other lack of coordination: Secondary | ICD-10-CM | POA: Diagnosis not present

## 2022-05-31 DIAGNOSIS — M25632 Stiffness of left wrist, not elsewhere classified: Secondary | ICD-10-CM

## 2022-06-04 NOTE — Therapy (Signed)
OUTPATIENT OCCUPATIONAL THERAPY TREATMENT NOTE  Patient Name: Jason Love MRN: [id number] DOB:1975-11-02, 47 y.o., male Today's Date: 06/07/2022  PCP: Derinda Late, MD REFERRING PROVIDER: Garald Balding, MD      OT End of Session - 06/07/22 [id number]     Visit Number 19    Number of Visits 20    Date for OT Re-Evaluation 06/15/22    Authorization Type BC[employer] - 70 visits remain    Authorization - Number of Visits 22    OT Start Time 0811    OT Stop Time 0850    OT Time Calculation (min) 39 min    Activity Tolerance Patient tolerated treatment well;Patient limited by fatigue;Patient limited by pain;No increased pain    Behavior During Therapy WFL for tasks assessed/performed               Past Medical History:  Diagnosis Date   GERD (gastroesophageal reflux disease)    mild-no meds used   Past Surgical History:  Procedure Laterality Date   KNEE ARTHROSCOPY  [place] ARTHROSCOPY Left 2013   SHOULDER ARTHROSCOPY Right 05/28/2017   Procedure: RIGHT SHOULDER ARTHROSCOPY, SUBACROMIAL DECOMPRESSION, DISTAL CLAVICLE RESECTION, POSSIBLE MINI OPEN ROTATOR CUFF REPAIR, [facility] PATCH;  Surgeon: Garald Balding, MD;  Location: [place];  Service: Orthopedics;  Laterality: Right;   ULNAR NERVE TRANSPOSITION Right 05/28/2017   Procedure: ULNAR NERVE DECOMPRESSION AT RIGHT ELBOW;  Surgeon: Garald Balding, MD;  Location: [place];  Service: Orthopedics;  Laterality: Right;   Patient Active Problem List   Diagnosis Date Noted   Cubital 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 02/01/2022   Impingement syndrome of left shoulder 10/05/2021   Pain in right knee 08/16/2021   Impingement syndrome of right shoulder 05/28/2017   Chronic right shoulder pain 04/05/2017   Cubital tunnel syndrome on right 01/24/2017   Paresthesias in right hand 01/02/2017   Shoulder pain 08/22/2016   Pure hypercholesterolemia 03/06/2016   Strain of rotator cuff capsule 01/30/2016   Lateral  epicondylitis 01/30/2016   Blood pressure elevated 02/11/2015   GERD without esophagitis 02/11/2015    ONSET DATE: DOS 01/25/22  REFERRING DIAG:  M75.42 (ICD-10-CM) - Impingement syndrome of left shoulder  G56.22 (ICD-10-CM) - Cubital 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    THERAPY DIAG:  Stiffness of left shoulder, not elsewhere classified  Muscle weakness (generalized)  Other lack of coordination  Stiffness of left wrist, not elsewhere classified  Paresthesia of skin  Chronic left shoulder pain  Stiffness of left elbow, not elsewhere classified  Acute pain of left shoulder  Pain in left elbow  Localized edema  Rationale for Evaluation and Treatment Rehabilitation  PRECAUTIONS: None now   SUBJECTIVE: (All objective assessments below are from initial evaluation unless otherwise specified.)   SUBJECTIVE STATEMENT: He states taking it "easy this" week still but less pain.    PAIN:  Are you having pain?  Yes he states feeling sharp pain in A/C joint and under for about 5-6 days  Rating: 2-3/10 at rest now   PATIENT GOALS To get Lt arm stronger, full motion and least pain as possible   OBJECTIVE: (All objective assessments below are from initial evaluation on: 02/20/22 unless otherwise specified.)   HAND DOMINANCE: Right   ADLs: Overall ADLs: 05/17/22: Basic ADLs no problems now, higher level tasks all improving as well.  Remaining difficulties and concerns are with work tasks that involve grasping and lifting objects that could be up  to 100 pounds which still bothers his elbow and shoulder.   FUNCTIONAL OUTCOME MEASURES: 05/17/22: PSFS 6.8 Today  Eval: Patient Specific Functional Scale: 0.6 (catch a baseball/football, reach to wash back, lift/flip a cabinet)  (Higher Score  =  Better Ability for the Selected Tasks)     (Baseline was scored 3.3 at eval on 10/12/21)   UPPER EXTREMITY ROM     Shoulder to Wrist AROM Left Eval 02/20/22 Lt 04/03/22 Lt 05/17/22  Shoulder  flexion 74 (AROM was 142* on 10/26/21)  121* 151  Shoulder abduction 62 (Was 127* on 10/26/21) 95* 135  Shoulder internal rotation Touches belly arm adducted (Was 20* on 10/26/21) 27* 53  Shoulder external rotation 31 (Was 54* on 10/26/21) 67* 67  Elbow flexion 94 (Was full motion on 10/12/21) Full motion   Elbow extension (-18) (Was full motion on 10/12/21) Full motion   Forearm supination 58 (Was 48* on 10/26/21)  Full Motion   Forearm pronation  90 (Was 90* on 10/12/21)  Full motion   Wrist flexion 58 (Was 60* on 10/24/21) Full motion   Wrist extension 67 (Was 67* on 10/24/21)  Full Motion now   (Blank rows = not tested)   UPPER EXTREMITY MMT:     MMT Left 04/03/22 Lt 05/17/22  Shoulder flexion 4-/5 MMT in somewhat limited ROM 5/5  Shoulder abduction 4-/5 MMT in somewhat limited ROM 4+/5  Shoulder adduction 4+/5 4+/5  Shoulder extension 4+/5 5/5  Shoulder internal rotation 4+/5 5/5  Shoulder external rotation 4/5  4+/5  Elbow flexion 4+/5 5/5  Elbow extension 4+/5 4+/5  Forearm supination 5/5 5/5  Forearm pronation 5/5 5/5  Wrist flexion 5/5 5/5  Wrist extension 4+/5 4+/5  (Blank rows = not tested)  HAND FUNCTION: 05/17/22: 100.7# tender to elbow (137# in Rt hand)   COORDINATION: 05/17/22: Lt 59 bocks today (60 is [facility])    TODAY'S TREATMENT:  06/07/22: Due to continued soreness about the A/C J, OT starts with manual therapy IASTM to sore and spasming infra and supraspinatus muscles and somewhat through lateral deltoid area.  He states feeling a bit looser after this and then tolerates UBE on 5.0 resistance for 5 mins for fnl endurance and to get  tissues mobilized. He is lead through IR and abd stretches on all, using a ball and behind his back and with a cane, before getting into dynamic swinging activity in low, shoulder height positions, increasing swing speed progressively. He finishes with additional EI/IR stretches and states somewhat looser at end but still aching around A/C  J.  OT reminds him to consider a compression shirt or neoprene sh brace for future management of A/C J issues as needed.  He was asked to continue stretching every day and get into throwing if still too sore/painful to go back into PRE yet. He was offered dry needling, which would likely be very helpful to him, but he states he has a needle aversion and doesn't want to do that modality.       PATIENT EDUCATION: Education details: See tx section above for details  Person educated: Patient Education method: Verbal Instruction, Teach back, Handouts  Education comprehension: States and demonstrates understanding, Additional Education required    HOME EXERCISE PROGRAM: Access Code: [email or web address] URL: [email or web address]  GOALS: Goals reviewed with patient? Yes   SHORT TERM GOALS: (STG required if POC>30 days) Target Date: 03/09/22  Pt will demo/state understanding of initial HEP to improve pain levels and prerequisite motion. Goal status: 03/01/22:  MET   LONG TERM GOALS: Target Date: 06/15/22     Pt will improve functional ability by decreased impairment per PSFS assessment from 0.6 to 6 or better, for better quality of life. Goal status: 05/17/22: MET- will be upgraded to 8 or better now   2.  Pt will improve grip strength in Lt hand from to at least 100lbs for functional use at home and in IADLs. (Comparing to baseline 90lbs with pain)  Goal status: 05/03/22: MET  3.  Pt will improve A/ROM in Lt shoulder flex and abduction from to at least 140* each, to have functional motion for tasks like reach and grasp.  Goal status: 05/17/22: Met in flexion (153*), but not in abduction yet (135*)  4.  Pt will improve strength in Lt elbow from 3-/5 MMT to at least 4+/5 MMT to have increased functional ability to carry out selfcare and higher-level homecare tasks with no difficulty. Goal status: 04/03/22: MET  5.  Pt will improve coordination skills in Lt arm, as seen by better score on  Box and Blocks testing to at least 65 blocks, to have increased functional ability to carry out fine motor tasks (fasteners, etc.) and more complex, coordinated IADLs (meal prep, sports, etc.).  Goal status: 05/17/22: Today 59 blocks (WFL is at least 60)   6.  Pt will decrease pain at worst from 6-7/10 to 2/10 or better to have better sleep and occupational participation in daily roles. Goal status: 05/17/22: MET- none significant recently   7. Using compensatory devices and modifications (elbow strap/shoulder brace/wrist brace or work glove), patient will lift and move at least a 50 pound box with no hand or grip 15 times from floor to countertop without significant pain or pulling at the elbow or shoulder.  (Stimulation of work tasks)  Goal Status: 05/17/22 New Goal today   ASSESSMENT:  CLINICAL IMPRESSION: 06/07/22: Next session he will be due for progress note and determine goal status. His pain has been relieving, but unfortunately hasn't gotten back into PRE yet due to concern or pain.   05/31/22: Unfortunately he has had an exacerbation of pain which continues to be centered mainly in the subacromial space, despite subacromial decompression and careful therapy progression.  It seems that he is just prone to this especially in pure abduction and so he should be modifying his reaching technique to avoid pure abduction and be more in the scapular plane.  He understands that this is a major "weakness" of his and needs to be dealt with over time carefully.  Continue plan of care and try to get back to stretching and strengthening more aggressively next week as he should be able to progress with his knowledge back to his new baselines.  PLAN: OT FREQUENCY: 1 x week (will stick to 20 total visits per last progress note- he has 4 visits left)   OT DURATION: up to 4 additional weeks (through 06/15/22 as needed)   PLANNED INTERVENTIONS: self care/ADL training, therapeutic exercise, therapeutic activity,  neuromuscular re-education, manual therapy, scar mobilization, passive range of motion, splinting, electrical stimulation, ultrasound, fluidotherapy, compression bandaging, moist heat, cryotherapy, contrast bath, patient/family education, and coping strategies training  CONSULTED AND AGREED WITH PLAN OF CARE: Patient  PLAN FOR NEXT SESSION:  Progress note, goal check, etc.     Benito Mccreedy, OTR/L, CHT 06/07/2022, 8:57 AM

## 2022-06-07 ENCOUNTER — Ambulatory Visit (INDEPENDENT_AMBULATORY_CARE_PROVIDER_SITE_OTHER): Payer: BC Managed Care – PPO | Admitting: Rehabilitative and Restorative Service Providers"

## 2022-06-07 ENCOUNTER — Encounter: Payer: Self-pay | Admitting: Rehabilitative and Restorative Service Providers"

## 2022-06-07 DIAGNOSIS — M6281 Muscle weakness (generalized): Secondary | ICD-10-CM

## 2022-06-07 DIAGNOSIS — M25522 Pain in left elbow: Secondary | ICD-10-CM

## 2022-06-07 DIAGNOSIS — M25622 Stiffness of left elbow, not elsewhere classified: Secondary | ICD-10-CM

## 2022-06-07 DIAGNOSIS — M25632 Stiffness of left wrist, not elsewhere classified: Secondary | ICD-10-CM

## 2022-06-07 DIAGNOSIS — R278 Other lack of coordination: Secondary | ICD-10-CM | POA: Diagnosis not present

## 2022-06-07 DIAGNOSIS — M25612 Stiffness of left shoulder, not elsewhere classified: Secondary | ICD-10-CM

## 2022-06-07 DIAGNOSIS — M25512 Pain in left shoulder: Secondary | ICD-10-CM

## 2022-06-07 DIAGNOSIS — G8929 Other chronic pain: Secondary | ICD-10-CM

## 2022-06-07 DIAGNOSIS — R202 Paresthesia of skin: Secondary | ICD-10-CM

## 2022-06-07 DIAGNOSIS — R600 Localized edema: Secondary | ICD-10-CM

## 2022-06-08 ENCOUNTER — Telehealth: Payer: Self-pay | Admitting: Physician Assistant

## 2022-06-08 NOTE — Telephone Encounter (Signed)
Patient called. The forms that Bevely Palmer filled out for him, needs more information. They need the restrictions date. His call back number is [phone]

## 2022-06-12 NOTE — Telephone Encounter (Signed)
Dates filled in,updated form faxed.

## 2022-06-13 NOTE — Therapy (Signed)
OUTPATIENT OCCUPATIONAL THERAPY TREATMENT & PROGRESS NOTE  Patient Name: Jason Love MRN: [id number] DOB:1975-12-03, 47 y.o., male Today's Date: 06/14/2022  PCP: Derinda Late, MD REFERRING PROVIDER: Persons, Mary-Anne, PA      OT End of Session - 06/14/22 0804     Visit Number 20    Number of Visits 20    Date for OT Re-Evaluation 07/13/22    Authorization Type [employer] - 77 visits remain    Authorization - Number of Visits 45    OT Start Time 0804    OT Stop Time 0843    OT Time Calculation (min) 39 min    Activity Tolerance Patient tolerated treatment well;Patient limited by fatigue;Patient limited by pain;No increased pain    Behavior During Therapy WFL for tasks assessed/performed                Past Medical History:  Diagnosis Date   GERD (gastroesophageal reflux disease)    mild-no meds used   Past Surgical History:  Procedure Laterality Date   KNEE ARTHROSCOPY  [place] ARTHROSCOPY Left 2013   SHOULDER ARTHROSCOPY Right 05/28/2017   Procedure: RIGHT SHOULDER ARTHROSCOPY, SUBACROMIAL DECOMPRESSION, DISTAL CLAVICLE RESECTION, POSSIBLE MINI OPEN ROTATOR CUFF REPAIR, [facility] [place] PATCH;  Surgeon: Garald Balding, MD;  Location: [place];  Service: Orthopedics;  Laterality: Right;   ULNAR NERVE TRANSPOSITION Right 05/28/2017   Procedure: ULNAR NERVE DECOMPRESSION AT RIGHT ELBOW;  Surgeon: Garald Balding, MD;  Location: [place];  Service: Orthopedics;  Laterality: Right;   Patient Active Problem List   Diagnosis Date Noted   Cubital 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 02/01/2022   Impingement syndrome of left shoulder 10/05/2021   Pain in right knee 08/16/2021   Impingement syndrome of right shoulder 05/28/2017   Chronic right shoulder pain 04/05/2017   Cubital tunnel syndrome on right 01/24/2017   Paresthesias in right hand 01/02/2017   Shoulder pain 08/22/2016   Pure hypercholesterolemia 03/06/2016   Strain of rotator cuff capsule  01/30/2016   Lateral epicondylitis 01/30/2016   Blood pressure elevated 02/11/2015   GERD without esophagitis 02/11/2015    ONSET DATE: DOS 01/25/22  REFERRING DIAG:  M75.42 (ICD-10-CM) - Impingement syndrome of left shoulder  G56.22 (ICD-10-CM) - Cubital 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    THERAPY DIAG:  Stiffness of left shoulder, not elsewhere classified  Muscle weakness (generalized)  Chronic left shoulder pain  Rationale for Evaluation and Treatment Rehabilitation  SUBJECTIVE: (All objective assessments below are from initial evaluation unless otherwise specified.)   SUBJECTIVE STATEMENT: He states still struggling with shoulder abduction a little bit, but he has been trying to work on it.  He does state being a little apprehensive to return to strengthening ever since his exacerbation of pain.   PAIN:  Are you having pain? Yes just mild under A/C J, a bit if moving  Rating: 1/10 at rest now   PATIENT GOALS To get Lt arm stronger, full motion and least pain as possible   OBJECTIVE: (All objective assessments below are from initial evaluation on: 02/20/22 unless otherwise specified.)   HAND DOMINANCE: Right   ADLs: Overall ADLs: 05/17/22: Basic ADLs no problems now, higher level tasks all improving as well.  Remaining difficulties and concerns are with work tasks that involve grasping and lifting objects that could be up to 100 pounds which still bothers his elbow and shoulder.   FUNCTIONAL OUTCOME MEASURES: 06/14/22: PSFS: 5.5 today (after exacerbation)  05/17/22:  PSFS 6.8 Today  Eval: Patient Specific Functional Scale: 0.6 (catch a baseball/football, reach to wash back, lift/flip a cabinet)  (Higher Score  =  Better Ability for the Selected Tasks)     (Baseline was scored 3.3 at eval on 10/12/21)   UPPER EXTREMITY ROM     Shoulder to Wrist AROM Left Eval 02/20/22 Lt 04/03/22 Lt 05/17/22 Lt 06/14/22  Shoulder flexion 74 (AROM was 142* on 10/26/21)  121* 151 147   Shoulder abduction 62 (Was 127* on 10/26/21) 95* 135 114  Shoulder internal rotation Touches belly arm adducted (Was 20* on 10/26/21) 27* 53 42  Shoulder external rotation 31 (Was 35* on 10/26/21) 67* 67 72  Elbow flexion 94 (Was full motion on 10/12/21) Full motion    Elbow extension (-18) (Was full motion on 10/12/21) Full motion    Forearm supination 58 (Was 48* on 10/26/21)  Full Motion    Forearm pronation  90 (Was 90* on 10/12/21)  Full motion    Wrist flexion 58 (Was 60* on 10/24/21) Full motion    Wrist extension 67 (Was 67* on 10/24/21)  Full Motion now    (Blank rows = not tested)   UPPER EXTREMITY MMT:     MMT Left 04/03/22 Lt 05/17/22 Lt 06/14/22  Shoulder flexion 4-/5 MMT in somewhat limited ROM 5/5 5/5  Shoulder abduction 4-/5 MMT in somewhat limited ROM 4+/5 4+/5  Shoulder adduction 4+/5 4+/5 4+/5  Shoulder extension 4+/5 5/5   Shoulder internal rotation 4+/5 5/5   Shoulder external rotation 4/5  4+/5 4/5 tender/pain  Elbow flexion 4+/5 5/5   Elbow extension 4+/5 4+/5 5/5  Forearm supination 5/5 5/5   Forearm pronation 5/5 5/5   Wrist flexion 5/5 5/5   Wrist extension 4+/5 4+/5 5/5  (Blank rows = not tested)  HAND FUNCTION: 06/14/22: 103.3#   05/17/22: 100.7# tender to elbow (137# in Rt hand)   COORDINATION: 06/14/22: 69 blocks today WNL now   05/17/22: Lt 31 bocks today (60 is [facility])    TODAY'S TREATMENT:  06/14/22: He does another progress visit after additional month of therapy. He tolerates MMT strength much better today in all planes but external rotation, and his ROM for measures shows a bit more tightness in abd and IR today.  He was cautioned to work almost exclusively on these "trouble areas now."  His home exercise program was updated (as below) to reflect remaining deficits and he was strongly encouraged to do this 3 times a day, and these were reviewed with him.  He has not purchased the shoulder sleeve yet, but was reminded that this may be a necessary  compensation for him especially for hard work tasks etc.  His grip continues to do better and his functional ability is also better today for less than shoulder height tasks, which is a positive finding.  Exercises - Wrist Flexion Stretch  - 4 x daily - 3-5 reps - 15 sec hold - Doorway Stretches (both arms low, lean in gently)   - 3-4 x daily - 3-5 reps - 15 hold - Standing Shoulder Internal Rotation Stretch Behind Back  - 4-6 x daily - 3-5 reps - 15 sec hold - Sleeper Stretch  - 3-4 x daily - 5 reps - 15-20 sec hold - Isometric Shoulder Adduction  - 4-6 x daily - 1 sets - 10-15 reps - Low Trap Setting at Wall  - 4-6 x daily - 1 sets - 10-15 reps - Standing Isometric Shoulder External  Rotation with Doorway  - 2-3 x daily - 1 sets - 5-10 reps - 3-5 sec hold - Shoulder External Rotation with Anchored Resistance  - 4-6 x daily - 1 sets - 10-15 reps   06/07/22: Due to continued soreness about the A/C J, OT starts with manual therapy IASTM to sore and spasming infra and supraspinatus muscles and somewhat through lateral deltoid area.  He states feeling a bit looser after this and then tolerates UBE on 5.0 resistance for 5 mins for fnl endurance and to get  tissues mobilized. He is lead through IR and abd stretches on all, using a ball and behind his back and with a cane, before getting into dynamic swinging activity in low, shoulder height positions, increasing swing speed progressively. He finishes with additional EI/IR stretches and states somewhat looser at end but still aching around A/C J.  OT reminds him to consider a compression shirt or neoprene sh brace for future management of A/C J issues as needed.  He was asked to continue stretching every day and get into throwing if still too sore/painful to go back into PRE yet. He was offered dry needling, which would likely be very helpful to him, but he states he has a needle aversion and doesn't want to do that modality.       PATIENT  EDUCATION: Education details: See tx section above for details  Person educated: Patient Education method: Verbal Instruction, Teach back, Handouts  Education comprehension: States and demonstrates understanding, Additional Education required    HOME EXERCISE PROGRAM: Access Code: [email or web address] URL: [email or web address]  GOALS: Goals reviewed with patient? Yes   SHORT TERM GOALS: (STG required if POC>30 days) Target Date: 03/09/22  Pt will demo/state understanding of initial HEP to improve pain levels and prerequisite motion. Goal status: 03/01/22: MET   LONG TERM GOALS: Target Date: 06/15/22     Pt will improve functional ability by decreased impairment per PSFS assessment from 0.6 to 8 or better, for better quality of life. Goal status: 06/14/22: unfortunately slightly worse today   2.  Pt will improve grip strength in Lt hand from to at least 100lbs for functional use at home and in IADLs. (Comparing to baseline 90lbs with pain)  Goal status: 05/03/22: MET  3.  Pt will improve A/ROM in Lt shoulder flex and abduction from to at least 140* each, to have functional motion for tasks like reach and grasp.  Goal status: 06/14/22: Slightly worse today, unfortunately  4.  Pt will improve strength in Lt elbow from 3-/5 MMT to at least 4+/5 MMT to have increased functional ability to carry out selfcare and higher-level homecare tasks with no difficulty. Goal status: 04/03/22: MET  5.  Pt will improve coordination skills in Lt arm, as seen by better score on Box and Blocks testing to at least 65 blocks, to have increased functional ability to carry out fine motor tasks (fasteners, etc.) and more complex, coordinated IADLs (meal prep, sports, etc.).  Goal status: 06/14/22: BETTER & MET  6.  Pt will decrease pain at worst from 6-7/10 to 2/10 or better to have better sleep and occupational participation in daily roles. Goal status: 06/14/22: Previously met, but now with some  pain/exacerbation for 2 weeks, though improving again.   7. Using compensatory devices and modifications (elbow strap/shoulder brace/wrist brace or work glove), patient will lift and move at least a 30 pound box with no hand or grip 10 times from floor to countertop without significant pain or  pulling at the elbow or shoulder.  (Stimulation of work tasks)  Goal Status: 06/14/22: NOT MET and adjusted to decrease weight (50# -> 30#)and reps (15->10) as this would probably cause another exacerbation.     ASSESSMENT:  CLINICAL IMPRESSION: 06/14/22: He has had some lingering pain and more difficulties with abduction and impingement type syndromes since exacerbation from lifting a heavy box multiple times.  Overall his grip is stronger his elbow feels fine now his strength is improved, but abduction and internal rotation combo are still quite difficult for him.  OT emphasizes that this must be worked on and tailored to plan of care to meet this goal.  It was also discussed that he would probably need to just compensate for this and take his time with this as this has been lingering issues for years for him most likely.  OT will ask for an additional month of therapy to target these last 2 remaining issues, and try to get back to strengthening in most planes of motion and avoiding exacerbation as possible.  This will be his last re-certification, as he will have reached maximum potential from therapy in another month, and this was explained to him. Will also talk about compensation and if he agrees we could try dry needling as well-though he has stated an aversion to needles.   PLAN: OT FREQUENCY: 1 x week  OT DURATION: up to 4 additional weeks (through 07/13/22 as needed)   PLANNED INTERVENTIONS: self care/ADL training, therapeutic exercise, therapeutic activity, neuromuscular re-education, manual therapy, scar mobilization, passive range of motion, splinting, electrical stimulation, ultrasound, fluidotherapy,  compression bandaging, moist heat, cryotherapy, contrast bath, patient/family education, coping strategies training, and Dry needling  CONSULTED AND AGREED WITH PLAN OF CARE: Patient  PLAN FOR NEXT SESSION:  Target treatment towards external rotation strength, abduction and internal rotation motion and when tolerated get back to light strengthening and functional activities with compensation to avoid exacerbation.  Tried dry needling if he states he could tolerate it.   Fannie Knee, OTR/L, CHT 06/14/2022, 9:06 AM

## 2022-06-14 ENCOUNTER — Ambulatory Visit (INDEPENDENT_AMBULATORY_CARE_PROVIDER_SITE_OTHER): Payer: BC Managed Care – PPO | Admitting: Rehabilitative and Restorative Service Providers"

## 2022-06-14 ENCOUNTER — Encounter: Payer: Self-pay | Admitting: Rehabilitative and Restorative Service Providers"

## 2022-06-14 DIAGNOSIS — M25512 Pain in left shoulder: Secondary | ICD-10-CM

## 2022-06-14 DIAGNOSIS — M6281 Muscle weakness (generalized): Secondary | ICD-10-CM | POA: Diagnosis not present

## 2022-06-14 DIAGNOSIS — M25612 Stiffness of left shoulder, not elsewhere classified: Secondary | ICD-10-CM | POA: Diagnosis not present

## 2022-06-14 DIAGNOSIS — G8929 Other chronic pain: Secondary | ICD-10-CM

## 2022-06-18 NOTE — Therapy (Signed)
OUTPATIENT OCCUPATIONAL THERAPY TREATMENT NOTE  Patient Name: Jason Love MRN: [id number] DOB:1975/10/24, 47 y.o., male Today's Date: 06/21/2022  PCP: Derinda Late, MD REFERRING PROVIDER: Persons, Mary-Anne, PA      OT End of Session - 06/21/22 0800     Visit Number 21    Number of Visits 24    Date for OT Re-Evaluation 07/13/22    Authorization Type [employer] - 40 visits remain    Authorization - Number of Visits 67    OT Start Time 0800    OT Stop Time 0847    OT Time Calculation (min) 47 min    Activity Tolerance Patient tolerated treatment well;Patient limited by fatigue;No increased pain    Behavior During Therapy WFL for tasks assessed/performed              Past Medical History:  Diagnosis Date   GERD (gastroesophageal reflux disease)    mild-no meds used   Past Surgical History:  Procedure Laterality Date   KNEE ARTHROSCOPY  [place] ARTHROSCOPY Left 2013   SHOULDER ARTHROSCOPY Right 05/28/2017   Procedure: RIGHT SHOULDER ARTHROSCOPY, SUBACROMIAL DECOMPRESSION, DISTAL CLAVICLE RESECTION, POSSIBLE MINI OPEN ROTATOR CUFF REPAIR, [facility] PATCH;  Surgeon: Garald Balding, MD;  Location: [place];  Service: Orthopedics;  Laterality: Right;   ULNAR NERVE TRANSPOSITION Right 05/28/2017   Procedure: ULNAR NERVE DECOMPRESSION AT RIGHT ELBOW;  Surgeon: Garald Balding, MD;  Location: [place];  Service: Orthopedics;  Laterality: Right;   Patient Active Problem List   Diagnosis Date Noted   Cubital 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 02/01/2022   Impingement syndrome of left shoulder 10/05/2021   Pain in right knee 08/16/2021   Impingement syndrome of right shoulder 05/28/2017   Chronic right shoulder pain 04/05/2017   Cubital tunnel syndrome on right 01/24/2017   Paresthesias in right hand 01/02/2017   Shoulder pain 08/22/2016   Pure hypercholesterolemia 03/06/2016   Strain of rotator cuff capsule 01/30/2016   Lateral epicondylitis 01/30/2016    Blood pressure elevated 02/11/2015   GERD without esophagitis 02/11/2015    ONSET DATE: DOS 01/25/22  REFERRING DIAG:  M75.42 (ICD-10-CM) - Impingement syndrome of left shoulder  G56.22 (ICD-10-CM) - Cubital 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    PAIN:  Are you having pain? Yes just mild under A/C J, a bit if moving  Rating: 1/10 at rest now   PATIENT GOALS To get Lt arm stronger, full motion and least pain as possible   OBJECTIVE: (All objective assessments below are from initial evaluation on: 02/20/22 unless otherwise specified.)   HAND DOMINANCE: Right   ADLs: Overall ADLs: 05/17/22: Basic ADLs no problems now, higher level tasks all improving as well.  Remaining difficulties and concerns are with work tasks that involve grasping and lifting objects that could be up to 100 pounds which still bothers his elbow and shoulder.   FUNCTIONAL OUTCOME MEASURES: 06/14/22: PSFS: 5.5 today (after exacerbation)  05/17/22: PSFS 6.8 Today  Eval: Patient Specific Functional Scale: 0.6 (catch a baseball/football, reach to wash back, lift/flip a cabinet)  (  Higher Score  =  Better Ability for the Selected Tasks)     (Baseline was scored 3.3 at eval on 10/12/21)   UPPER EXTREMITY ROM     Shoulder to Wrist AROM Left Eval 02/20/22 Lt 04/03/22 Lt 05/17/22 Lt 06/14/22  Shoulder flexion 74 (AROM was 142* on 10/26/21)  121* 151 147  Shoulder abduction 62 (Was 127* on 10/26/21) 95* 135 114  Shoulder internal rotation Touches belly arm adducted (Was 20* on  10/26/21) 27* 53 42  Shoulder external rotation 31 (Was 2* on 10/26/21) 67* 67 72  Elbow flexion 94 (Was full motion on 10/12/21) Full motion    Elbow extension (-18) (Was full motion on 10/12/21) Full motion    Forearm supination 58 (Was 48* on 10/26/21)  Full Motion    Forearm pronation  90 (Was 90* on 10/12/21)  Full motion    Wrist flexion 58 (Was 60* on 10/24/21) Full motion    Wrist extension 67 (Was 67* on 10/24/21)  Full Motion now    (Blank rows = not tested)   UPPER EXTREMITY MMT:     MMT Left 04/03/22 Lt 05/17/22 Lt 06/14/22  Shoulder flexion 4-/5 MMT in somewhat limited ROM 5/5 5/5  Shoulder abduction 4-/5 MMT in somewhat limited ROM 4+/5 4+/5  Shoulder adduction 4+/5 4+/5 4+/5  Shoulder extension 4+/5 5/5   Shoulder internal rotation 4+/5 5/5   Shoulder external rotation 4/5  4+/5 4/5 tender/pain  Elbow flexion 4+/5 5/5   Elbow extension 4+/5 4+/5 5/5  Forearm supination 5/5 5/5   Forearm pronation 5/5 5/5   Wrist flexion 5/5 5/5   Wrist extension 4+/5 4+/5 5/5  (Blank rows = not tested)  HAND FUNCTION: 06/14/22: 103.3#   05/17/22: 100.7# tender to elbow (137# in Rt hand)   COORDINATION: 06/14/22: 69 blocks today WNL now     TODAY'S TREATMENT:  06/21/22: He starts with UBE for fnl endurance, dynamic motion to the RTC on 4.5 resistance x50rpm x 7 mins. He then lies supine and OT does manual stretches and joint mobs to Lt shoulder in ER, IR, abd. He then stands to do isometric strength in ER, Abd, add 10-15x each. He uses green t-band for IR strength x15, then dynamic body blade to work ER/IR and flex/ext.   He then performs throwing in side-arm working to overhead in single hand throwing positions. He states being a bit sore at the end of the session and was advised to go home, ice and stretch as needed.    PATIENT EDUCATION: Education details: See tx section above for details  Person educated: Patient Education method: Verbal Instruction, Teach back, Handouts   Education comprehension: States and demonstrates understanding, Additional Education required    HOME EXERCISE PROGRAM: Access Code: [email or web address] URL: [email or web address]  GOALS: Goals reviewed with patient? Yes   SHORT TERM GOALS: (STG required if POC>30 days) Target Date: 03/09/22  Pt will demo/state understanding of initial HEP to improve pain levels and prerequisite motion. Goal status: 03/01/22: MET   LONG TERM GOALS: Target Date: 07/13/22   Pt will improve functional ability by decreased impairment per PSFS assessment from 0.6 to 8 or better, for better quality of life. Goal status: 06/14/22: unfortunately slightly worse today   2.  Pt will improve grip strength in Lt hand from to at least 100lbs for functional use at home and in IADLs. (Comparing to baseline 90lbs with pain)  Goal status: 05/03/22: MET  3.  Pt will improve A/ROM in Lt shoulder  flex and abduction from to at least 140* each, to have functional motion for tasks like reach and grasp.  Goal status: 06/14/22: Slightly worse today, unfortunately  4.  Pt will improve strength in Lt elbow from 3-/5 MMT to at least 4+/5 MMT to have increased functional ability to carry out selfcare and higher-level homecare tasks with no difficulty. Goal status: 04/03/22: MET  5.  Pt will improve coordination skills in Lt arm, as seen by better score on Box and Blocks testing to at least 65 blocks, to have increased functional ability to carry out fine motor tasks (fasteners, etc.) and more complex, coordinated IADLs (meal prep, sports, etc.).  Goal status: 06/14/22: BETTER & MET  6.  Pt will decrease pain at worst from 6-7/10 to 2/10 or better to have better sleep and occupational participation in daily roles. Goal status: 06/14/22: Previously met, but now with some pain/exacerbation for 2 weeks, though improving again.   7. Using compensatory devices and modifications (elbow strap/shoulder brace/wrist brace or work glove),  patient will lift and move at least a 30 pound box with no hand or grip 10 times from floor to countertop without significant pain or pulling at the elbow or shoulder.  (Stimulation of work tasks)  Goal Status: 06/14/22: NOT MET and adjusted to decrease weight (50# -> 30#)and reps (15->10) as this would probably cause another exacerbation.     ASSESSMENT:  CLINICAL IMPRESSION: 06/21/22: He is doing well and seeming to tolerate strengthening though his external rotation is still very weak.  He was told to really focus on these things but not so much that he puts himself in a relapse of pain.  He still has not got a shoulder brace yet, and he states not tolerating Kinesiotape or dry needling which is unfortunate and somewhat limiting.  Continue plan of care  06/14/22: He has had some lingering pain and more difficulties with abduction and impingement type syndromes since exacerbation from lifting a heavy box multiple times.  Overall his grip is stronger his elbow feels fine now his strength is improved, but abduction and internal rotation combo are still quite difficult for him.  OT emphasizes that this must be worked on and tailored to plan of care to meet this goal.  It was also discussed that he would probably need to just compensate for this and take his time with this as this has been lingering issues for years for him most likely.  OT will ask for an additional month of therapy to target these last 2 remaining issues, and try to get back to strengthening in most planes of motion and avoiding exacerbation as possible.  This will be his last re-certification, as he will have reached maximum potential from therapy in another month, and this was explained to him. Will also talk about compensation and if he agrees we could try dry needling as well-though he has stated an aversion to needles.   PLAN: OT FREQUENCY: 1 x week  OT DURATION: up to 4 additional weeks (through 07/13/22 as needed)   PLANNED  INTERVENTIONS: self care/ADL training, therapeutic exercise, therapeutic activity, neuromuscular re-education, manual therapy, scar mobilization, passive range of motion, splinting, electrical stimulation, ultrasound, fluidotherapy, compression bandaging, moist heat, cryotherapy, contrast bath, patient/family education, coping strategies training, and Dry needling  CONSULTED AND AGREED WITH PLAN OF CARE: Patient  PLAN FOR NEXT SESSION:   We will continue to work on remaining issues of external rotation weakness tightness and internal rotation and abduction.  Benito Mccreedy, OTR/L, CHT 06/21/2022, 2:26 PM

## 2022-06-19 ENCOUNTER — Telehealth: Payer: Self-pay | Admitting: Physician Assistant

## 2022-06-19 NOTE — Telephone Encounter (Signed)
Pt called requesting a call from [employer] about some forms she filled out that need restrictions on it. Pt states he left a message a few days ago and need a call back. Please call pt at [phone]. P tasked for this message to be sent urgent

## 2022-06-20 NOTE — Telephone Encounter (Signed)
IC spoke with patient. Advised we can do nothing more with the form. Advised if [employer] is denying his claim the form is not supporting his claim,patient hasn't been seen since 05/02/22, he would need to schedule an appt with Dr. Marlou Sa or Dr. Erlinda Hong so there is new medical documentation. He states [employer] just needs the restrictions written out. I advised they were on the form, I added again to the form and refaxed. Reiterated to the patient that the form may not support his claim. He expressed understanding.

## 2022-06-21 ENCOUNTER — Encounter: Payer: Self-pay | Admitting: Rehabilitative and Restorative Service Providers"

## 2022-06-21 ENCOUNTER — Ambulatory Visit (INDEPENDENT_AMBULATORY_CARE_PROVIDER_SITE_OTHER): Payer: BC Managed Care – PPO | Admitting: Rehabilitative and Restorative Service Providers"

## 2022-06-21 DIAGNOSIS — M25612 Stiffness of left shoulder, not elsewhere classified: Secondary | ICD-10-CM | POA: Diagnosis not present

## 2022-06-21 DIAGNOSIS — M6281 Muscle weakness (generalized): Secondary | ICD-10-CM

## 2022-06-21 DIAGNOSIS — M25512 Pain in left shoulder: Secondary | ICD-10-CM | POA: Diagnosis not present

## 2022-06-21 DIAGNOSIS — G8929 Other chronic pain: Secondary | ICD-10-CM

## 2022-06-21 DIAGNOSIS — M25522 Pain in left elbow: Secondary | ICD-10-CM

## 2022-06-21 DIAGNOSIS — R278 Other lack of coordination: Secondary | ICD-10-CM | POA: Diagnosis not present

## 2022-06-25 NOTE — Therapy (Signed)
OUTPATIENT OCCUPATIONAL THERAPY TREATMENT NOTE  Patient Name: Jason Love MRN: [id number] DOB:[birth year], 47 y.o., male Today's Date: 06/28/2022  PCP: Derinda Late, MD REFERRING PROVIDER: Persons, Mary-Anne, PA      OT End of Session - 06/28/22 0800     Visit Number 22    Number of Visits 24    Date for OT Re-Evaluation 07/13/22    Authorization Type BC[employer] - 66 visits remain    Authorization - Number of Visits 17    OT Start Time 0801    OT Stop Time 0846    OT Time Calculation (min) 45 min    Activity Tolerance Patient tolerated treatment well;Patient limited by fatigue;No increased pain    Behavior During Therapy WFL for tasks assessed/performed             Past Medical History:  Diagnosis Date   GERD (gastroesophageal reflux disease)    mild-no meds used   Past Surgical History:  Procedure Laterality Date   KNEE ARTHROSCOPY  [place] ARTHROSCOPY Left 2013   SHOULDER ARTHROSCOPY Right 05/28/2017   Procedure: RIGHT SHOULDER ARTHROSCOPY, SUBACROMIAL DECOMPRESSION, DISTAL CLAVICLE RESECTION, POSSIBLE MINI OPEN ROTATOR CUFF REPAIR, [facility] PATCH;  Surgeon: Garald Balding, MD;  Location: ;  Service: Orthopedics;  Laterality: Right;   ULNAR NERVE TRANSPOSITION Right 05/28/2017   Procedure: ULNAR NERVE DECOMPRESSION AT RIGHT ELBOW;  Surgeon: Garald Balding, MD;  Location: [place];  Service: Orthopedics;  Laterality: Right;   Patient Active Problem List   Diagnosis Date Noted   Cubital 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 02/01/2022   Impingement syndrome of left shoulder 10/05/2021   Pain in right knee 08/16/2021   Impingement syndrome of right shoulder 05/28/2017   Chronic right shoulder pain 04/05/2017   Cubital tunnel syndrome on right 01/24/2017   Paresthesias in right hand 01/02/2017   Shoulder pain 08/22/2016   Pure hypercholesterolemia 03/06/2016   Strain of rotator cuff capsule 01/30/2016   Lateral epicondylitis 01/30/2016    Blood pressure elevated 02/11/2015   GERD without esophagitis 02/11/2015    ONSET DATE: DOS 01/25/22  REFERRING DIAG:  M75.42 (ICD-10-CM) - Impingement syndrome of left shoulder  G56.22 (ICD-10-CM) - Cubital 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    THERAPY DIAG:  Muscle weakness (generalized)  Other lack of coordination  Pain in left elbow  Stiffness of left shoulder, not elsewhere classified  Chronic left shoulder pain  Stiffness of left wrist, not elsewhere classified  Rationale for Evaluation and Treatment Rehabilitation  SUBJECTIVE: (All objective assessments below are from initial evaluation unless otherwise specified.)   SUBJECTIVE STATEMENT: He states front of shoulder sore today after wall pushups yesterday, but not too badly.    PAIN:  Are you having pain?  Yes just mild under A/C J, a bit if moving  Rating: 3/10 at rest now   PATIENT GOALS To get Lt arm stronger, full motion and least pain as possible   OBJECTIVE: (All objective assessments below are from initial evaluation on: 02/20/22 unless otherwise specified.)   HAND DOMINANCE: Right   ADLs: Overall ADLs: 05/17/22: Basic ADLs no problems now, higher level tasks all improving as well.  Remaining difficulties and concerns are with work tasks that involve grasping and lifting objects that could be up to 100 pounds which still bothers his elbow and shoulder.   FUNCTIONAL OUTCOME MEASURES: 06/14/22: PSFS: 5.5 today (after exacerbation)  05/17/22: PSFS 6.8 Today  Eval: Patient Specific Functional Scale: 0.6 (  catch a baseball/football, reach to wash back, lift/flip a cabinet)  (Higher Score  =  Better Ability for the Selected Tasks)     (Baseline was scored 3.3 at eval on 10/12/21)   UPPER EXTREMITY ROM     Shoulder to Wrist AROM Left Eval 02/20/22 Lt 04/03/22 Lt 05/17/22 Lt 06/14/22  Shoulder flexion 74 (AROM was 142* on 10/26/21)  121* 151 147  Shoulder abduction 62 (Was 127* on 10/26/21) 95* 135 114  Shoulder  internal rotation Touches belly arm adducted (Was 20* on 10/26/21) 27* 53 42  Shoulder external rotation 31 (Was 21* on 10/26/21) 67* 67 72  Elbow flexion 94 (Was full motion on 10/12/21) Full motion    Elbow extension (-18) (Was full motion on 10/12/21) Full motion    Forearm supination 58 (Was 48* on 10/26/21)  Full Motion    Forearm pronation  90 (Was 90* on 10/12/21)  Full motion    Wrist flexion 58 (Was 60* on 10/24/21) Full motion    Wrist extension 67 (Was 67* on 10/24/21)  Full Motion now    (Blank rows = not tested)   UPPER EXTREMITY MMT:     MMT Left 04/03/22 Lt 05/17/22 Lt 06/14/22  Shoulder flexion 4-/5 MMT in somewhat limited ROM 5/5 5/5  Shoulder abduction 4-/5 MMT in somewhat limited ROM 4+/5 4+/5  Shoulder adduction 4+/5 4+/5 4+/5  Shoulder extension 4+/5 5/5   Shoulder internal rotation 4+/5 5/5   Shoulder external rotation 4/5  4+/5 4/5 tender/pain  Elbow flexion 4+/5 5/5   Elbow extension 4+/5 4+/5 5/5  Forearm supination 5/5 5/5   Forearm pronation 5/5 5/5   Wrist flexion 5/5 5/5   Wrist extension 4+/5 4+/5 5/5  (Blank rows = not tested)  HAND FUNCTION: 06/14/22: 103.3#   05/17/22: 100.7# tender to elbow (137# in Rt hand)   COORDINATION: 06/14/22: 69 blocks today WNL now     TODAY'S TREATMENT:  06/28/22: He continues with RTC strengthening and OT problem solving of pains and slight exacerbations from returning to daily routines and doing strengthening at home. He tolerates today's session well, relieving pain and doing appropriate, guided strengthening and fnl motion. He leaves stating feeling better than at entrance, and understanding of what to continue at home, self-monitoring to prevent painful exacerbations. Today his pain was more on long head biceps tendon area, so avoiding tubercle impingement in internally rotated, forward shoulder postures and management with specific stretches and forearm rotation educated on.    UBE 4.5 x 4 mins 50+ RPM  Doorway  stretches, abd stretches on wall, biceps stretches ("fridge door")   Isometric strength in IR, ER x10 each for 5 sec holds  Biceps eccentric 25# x10  Rows 55# x15 slowly  Throwing for dynamic ER/ IR    PATIENT EDUCATION: Education details: See tx section above for details  Person educated: Patient Education method: Veterinary surgeon, Teach back, Handouts  Education comprehension: States and demonstrates understanding, Additional Education required    HOME EXERCISE PROGRAM: Access Code: [email or web address] URL: [email or web address]  GOALS: Goals reviewed with patient? Yes   SHORT TERM GOALS: (STG required if POC>30 days) Target Date: 03/09/22  Pt will demo/state understanding of initial HEP to improve pain levels and prerequisite motion. Goal status: 03/01/22: MET   LONG TERM GOALS: Target Date: 07/13/22   Pt will improve functional ability by decreased impairment per PSFS assessment from 0.6 to 8 or better, for better quality of life. Goal status: 06/14/22: unfortunately slightly worse today  2.  Pt will improve grip strength in Lt hand from to at least 100lbs for functional use at home and in IADLs. (Comparing to baseline 90lbs with pain)  Goal status: 05/03/22: MET  3.  Pt will improve A/ROM in Lt shoulder flex and abduction from to at least 140* each, to have functional motion for tasks like reach and grasp.  Goal status: 06/14/22: Slightly worse today, unfortunately  4.  Pt will improve strength in Lt elbow from 3-/5 MMT to at least 4+/5 MMT to have increased functional ability to carry out selfcare and higher-level homecare tasks with no difficulty. Goal status: 04/03/22: MET  5.  Pt will improve coordination skills in Lt arm, as seen by better score on Box and Blocks testing to at least 65 blocks, to have increased functional ability to carry out fine motor tasks (fasteners, etc.) and more complex, coordinated IADLs (meal prep, sports, etc.).  Goal status: 06/14/22:  BETTER & MET  6.  Pt will decrease pain at worst from 6-7/10 to 2/10 or better to have better sleep and occupational participation in daily roles. Goal status: 06/14/22: Previously met, but now with some pain/exacerbation for 2 weeks, though improving again.   7. Using compensatory devices and modifications (elbow strap/shoulder brace/wrist brace or work glove), patient will lift and move at least a 30 pound box with no hand or grip 10 times from floor to countertop without significant pain or pulling at the elbow or shoulder.  (Stimulation of work tasks)  Goal Status: 06/14/22: NOT MET and adjusted to decrease weight (50# -> 30#)and reps (15->10) as this would probably cause another exacerbation.     ASSESSMENT:  CLINICAL IMPRESSION: 06/28/22: Other than some mild aches and pains, he is relatively doing very well more knowledgeable about self-management and self-awareness of symptoms, anatomy, ways to manage.  He is coming up on his end of his plan of care and goals will be addressed in the next 1-2 visits as needed.    PLAN: OT FREQUENCY: 1 x week  OT DURATION: up to 4 additional weeks (through 07/13/22 as needed)   PLANNED INTERVENTIONS: self care/ADL training, therapeutic exercise, therapeutic activity, neuromuscular re-education, manual therapy, scar mobilization, passive range of motion, splinting, electrical stimulation, ultrasound, fluidotherapy, compression bandaging, moist heat, cryotherapy, contrast bath, patient/family education, coping strategies training, and Dry needling  CONSULTED AND AGREED WITH PLAN OF CARE: Patient  PLAN FOR NEXT SESSION:   Address goals and range of motion for discharge coming soon.  Try to finalize home exercise program and recommendations.  Benito Mccreedy, OTR/L, CHT 06/28/2022, 10:13 AM

## 2022-06-28 ENCOUNTER — Ambulatory Visit (INDEPENDENT_AMBULATORY_CARE_PROVIDER_SITE_OTHER): Payer: BC Managed Care – PPO | Admitting: Rehabilitative and Restorative Service Providers"

## 2022-06-28 ENCOUNTER — Encounter: Payer: Self-pay | Admitting: Rehabilitative and Restorative Service Providers"

## 2022-06-28 DIAGNOSIS — M25632 Stiffness of left wrist, not elsewhere classified: Secondary | ICD-10-CM

## 2022-06-28 DIAGNOSIS — M25612 Stiffness of left shoulder, not elsewhere classified: Secondary | ICD-10-CM | POA: Diagnosis not present

## 2022-06-28 DIAGNOSIS — M25512 Pain in left shoulder: Secondary | ICD-10-CM

## 2022-06-28 DIAGNOSIS — M6281 Muscle weakness (generalized): Secondary | ICD-10-CM

## 2022-06-28 DIAGNOSIS — M25522 Pain in left elbow: Secondary | ICD-10-CM | POA: Diagnosis not present

## 2022-06-28 DIAGNOSIS — R278 Other lack of coordination: Secondary | ICD-10-CM

## 2022-06-28 DIAGNOSIS — G8929 Other chronic pain: Secondary | ICD-10-CM

## 2022-06-29 NOTE — Therapy (Signed)
OUTPATIENT OCCUPATIONAL THERAPY TREATMENT & DISCHARGE NOTE  Patient Name: Jason Love MRN: [id number] DOB:[birth year], 47 y.o., male Today's Date: 07/05/2022  PCP: Derinda Late, MD REFERRING PROVIDER: Persons, Mary-Anne, PA    OT End of Session - 07/05/22 0801     Visit Number 23    Number of Visits 24    Date for OT Re-Evaluation 07/13/22    Authorization Type BC[employer] - 29 visits remain    Authorization - Number of Visits 75    OT Start Time 0801    OT Stop Time 0847    OT Time Calculation (min) 46 min    Activity Tolerance Patient tolerated treatment well;Patient limited by fatigue;Patient limited by pain    Behavior During Therapy WFL for tasks assessed/performed            Past Medical History:  Diagnosis Date   GERD (gastroesophageal reflux disease)    mild-no meds used   Past Surgical History:  Procedure Laterality Date   KNEE ARTHROSCOPY  [place] ARTHROSCOPY Left 2013   SHOULDER ARTHROSCOPY Right 05/28/2017   Procedure: RIGHT SHOULDER ARTHROSCOPY, SUBACROMIAL DECOMPRESSION, DISTAL CLAVICLE RESECTION, POSSIBLE MINI OPEN ROTATOR CUFF REPAIR, [facility] PATCH;  Surgeon: Garald Balding, MD;  Location: [place];  Service: Orthopedics;  Laterality: Right;   ULNAR NERVE TRANSPOSITION Right 05/28/2017   Procedure: ULNAR NERVE DECOMPRESSION AT RIGHT ELBOW;  Surgeon: Garald Balding, MD;  Location: [place];  Service: Orthopedics;  Laterality: Right;   Patient Active Problem List   Diagnosis Date Noted   Cubital 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 02/01/2022   Impingement syndrome of left shoulder 10/05/2021   Pain in right knee 08/16/2021   Impingement syndrome of right shoulder 05/28/2017   Chronic right shoulder pain 04/05/2017   Cubital tunnel syndrome on right 01/24/2017   Paresthesias in right hand 01/02/2017   Shoulder pain 08/22/2016   Pure hypercholesterolemia 03/06/2016   Strain of rotator cuff capsule 01/30/2016   Lateral epicondylitis  01/30/2016   Blood pressure elevated 02/11/2015   GERD without esophagitis 02/11/2015    ONSET DATE: DOS 01/25/22  REFERRING DIAG:  M75.42 (ICD-10-CM) - Impingement syndrome of left shoulder  G56.22 (ICD-10-CM) - Cubital 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    THERAPY DIAG:  Muscle weakness (generalized)  Other lack of coordination  Chronic left shoulder pain  Stiffness of left shoulder, not elsewhere classified  Acute pain of left shoulder  Rationale for Evaluation and Treatment Rehabilitation  SUBJECTIVE: (All objective assessments below are from initial evaluation unless otherwise specified.)   SUBJECTIVE STATEMENT: He states still has a "stab" feeling under A/C J area if moving in certain ways (abd, ER), though continuing to do home exercises.    PAIN:  Are you having pain?  Yes just mild under A/C J, a bit more if moving  Rating: 2-3/10 at rest now   PATIENT GOALS To get Lt arm stronger, full motion and least pain as possible   OBJECTIVE: (All objective assessments below are from initial evaluation on: 02/20/22 unless otherwise specified.)   HAND DOMINANCE: Right   ADLs: Overall ADLs:  07/05/22: He states having no significant issues with basic or household ADLs now, but anything requiring heavy lifting and overhead lifting remains tender and painful to the left shoulder.  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)   06/14/22: PSFS: 5.5 today (after exacerbation)  05/17/22: PSFS 6.8 Today  Eval:  Patient Specific Functional Scale: 0.6 (catch a baseball/football, reach to wash back, lift/flip a cabinet)  (Higher Score  =  Better Ability for the Selected Tasks)    (Baseline was scored 3.3 at eval on 10/12/21)   UPPER EXTREMITY ROM     Shoulder to Wrist AROM Left Eval 02/20/22 Lt 04/03/22 Lt 05/17/22 Lt 07/05/22  Shoulder flexion 74 (AROM was 142* on 10/26/21)  121* 151 148*  Shoulder abduction  62 (Was 127* on 10/26/21) 95* 135 136  Shoulder internal rotation Touches belly arm adducted (Was 20* on 10/26/21) 27* 53 41  Shoulder external rotation 31 (Was 23* on 10/26/21) 67* 67 77  Elbow flexion 94 (Was full motion on 10/12/21) Full motion  Full  Elbow extension (-18) (Was full motion on 10/12/21) Full motion  Full  Forearm supination 58 (Was 48* on 10/26/21)  Full Motion  Full  Forearm pronation  90 (Was 90* on 10/12/21)  Full motion  Full  Wrist flexion 58 (Was 60* on 10/24/21) Full motion  Full  Wrist extension 67 (Was 67* on 10/24/21)  Full Motion now  Full  (Blank rows = not tested)   UPPER EXTREMITY MMT:     MMT Left 04/03/22 Lt 05/17/22 Lt 06/14/22 Lt 07/05/22  Shoulder flexion 4-/5 MMT in somewhat limited ROM 5/5 5/5 5/5  Shoulder abduction 4-/5 MMT in somewhat limited ROM 4+/5 4+/5 4+/5  Shoulder adduction 4+/5 4+/5 4+/5 4+/5  Shoulder extension 4+/5 5/5  5/5  Shoulder internal rotation 4+/5 5/5  5/5  Shoulder external rotation 4/5  4+/5 4/5 tender/pain 4+/5 not tender  Elbow flexion 4+/5 5/5  5/5  Elbow extension 4+/5 4+/5 5/5 5/5  Forearm supination 5/5 5/5    Forearm pronation 5/5 5/5    Wrist flexion 5/5 5/5  5/5  Wrist extension 4+/5 4+/5 5/5 5/5  (Blank rows = not tested)  HAND FUNCTION: 06/14/22: 103.3# Lt grip strength- good and normal for him   COORDINATION: 06/14/22: 69 blocks today WNL coordination now for light tasks less than shoulder height   TODAY'S TREATMENT:  07/05/22: Pt performs AROM, gripping, and strength with Lt arm (shoulder to wrist/hand) against therapist's resistance for exercise/activities as well as new measures today (see below also for fnl endurance and fnl simulation activities that were performed today).  He continues to have issues in certain ranges of motion only but has made some progress in others as well as strengthening.  OT also discusses home and functional tasks with the pt and reviews goals. Using the complied data, OT also  reviews home exercises and provides final recommendations and upgrades. Pt states understanding the need to continue nonpainful stretches and exercises as tolerated as well as avoid exacerbating positions and activities.  He agrees that he has likely maximized his benefit from therapy at this point.  UBE x 3 mins on 5.0 resistance  Sh abd stretches and IR stretches (review and performance)  ER isometrics, Add isometrics (review and performance)  28# box lift to shoulder height x10 and fatigues quickly, tolerating fairly well 38# box lift to shoulder height x1 and painful, not tolerated well    PATIENT EDUCATION: Education details: See tx section above for details  Person educated: Patient Education method: Verbal Instruction, Teach back, Handouts  Education comprehension: States and demonstrates understanding   HOME EXERCISE PROGRAM: Access Code: [email or web address] URL: [email or web address]  GOALS: Goals reviewed with patient? Yes   SHORT TERM GOALS: (STG required if POC>30 days) Target Date: 03/09/22  Pt will demo/state  understanding of initial HEP to improve pain levels and prerequisite motion. Goal status: 03/01/22: MET   LONG TERM GOALS: Target Date: 07/13/22   Pt will improve functional ability by decreased impairment per PSFS assessment from 0.6 to 8 or better, for better quality of life. Goal status:  07/05/22: Not Met- 5.3 now  2.  Pt will improve grip strength in Lt hand from to at least 100lbs for functional use at home and in IADLs. (Comparing to baseline 90lbs with pain)  Goal status: 05/03/22: MET  3.  Pt will improve A/ROM in Lt shoulder flex and abduction from to at least 140* each, to have functional motion for tasks like reach and grasp.  Goal status:  07/05/22: Partially Met- Flexion 148*, Abd 136*   4.  Pt will improve strength in Lt elbow from 3-/5 MMT to at least 4+/5 MMT to have increased functional ability to carry out selfcare and higher-level  homecare tasks with no difficulty. Goal status: 04/03/22: MET  5.  Pt will improve coordination skills in Lt arm, as seen by better score on Box and Blocks testing to at least 65 blocks, to have increased functional ability to carry out fine motor tasks (fasteners, etc.) and more complex, coordinated IADLs (meal prep, sports, etc.).  Goal status: 06/14/22: BETTER & MET  6.  Pt will decrease pain at worst from 6-7/10 to 2/10 or better to have better sleep and occupational participation in daily roles. Goal status:  07/05/22: Not Met as he states pain up to 2-3/10 at rest when sore and higher at times, at worst.   06/14/22: Previously met, but now with some pain/exacerbation for 2 weeks, though improving again.   7. Using compensatory devices and modifications (elbow strap/shoulder brace/wrist brace or work glove), patient will lift and move at least a 30 pound box with no hand or grip 10 times from floor to countertop without significant pain or pulling at the elbow or shoulder.  (Stimulation of work tasks)  Goal Status: 07/05/22: MET @ 28#.  Unable when done with 38#   06/14/22: NOT MET and adjusted to decrease weight (50# -> 30#)and reps (15->10) as this would probably cause another exacerbation.     ASSESSMENT:  CLINICAL IMPRESSION: 07/05/22: While he has approved his shoulder abduction and strength somewhat, he has not made any other significant progress in about a month, his pain remains relatively the same with frequent exacerbations and tenderness as well as "stabbing" pains under the acromion with overhead lifting (typical of impingement syndrome).  At this point he will be discharged with maximum potential gained from therapy at this time.    OT feels that the chronic nature of his left shoulder pain could be from habitual sources like sleep postures or habits that require impinging painful motions.  He was encouraged to avoid these things, let his shoulder rest and at the same time continue  stretching and strengthening in tolerable ways as performed in therapy together.  Since the initial start of care, he has made excellent progress in terms of motion and strength and he is pleased with that, however he has been unable to tolerate return to heavier activities especially those that require reaching over shoulder height.  Discharge to self-management at this point, with maximum potential gained from therapy now.  OT would recommend to avoid lifting over shoulder height repetitively, avoid repetitive lifting of 40+pounds, and avoid painful impingement with abduction and external rotation motions as able, as he has not been able  to tolerate that in therapy after many attempts.  OT also has recommended him to wear a shoulder brace of some type (tight, neoprene etc.) to help him reduce impact to his shoulder, which he has not gotten as of yet.    PLAN: OT FREQUENCY: 1 x week  OT DURATION: up to 4 additional weeks (through 07/13/22 as needed)   PLANNED INTERVENTIONS: self care/ADL training, therapeutic exercise, therapeutic activity, neuromuscular re-education, manual therapy, scar mobilization, passive range of motion, splinting, electrical stimulation, ultrasound, fluidotherapy, compression bandaging, moist heat, cryotherapy, contrast bath, patient/family education, coping strategies training, and Dry needling  CONSULTED AND AGREED WITH PLAN OF CARE: Patient  PLAN FOR NEXT SESSION:   N/A discharge now    [employer], OTR/L, CHT 07/05/2022, 4:57 PM   OCCUPATIONAL THERAPY DISCHARGE SUMMARY  Visits from Start of Care: 23  Current functional level related to goals / functional outcomes: Please see the goals above and information and under "assessment" and "plan" for details  Remaining deficits: Please see the goals above iand information and under "assessment" and "plan" for details  Education / Equipment: Pt has all needed materials and education. Pt understands how to  continue on with self-management. See tx notes for more details.   Patient agrees to discharge due to max benefits received from outpatient occupational therapy at this time.   Benito Mccreedy, OTR/L, CHT 07/05/22

## 2022-07-05 ENCOUNTER — Ambulatory Visit (INDEPENDENT_AMBULATORY_CARE_PROVIDER_SITE_OTHER): Payer: BC Managed Care – PPO | Admitting: Rehabilitative and Restorative Service Providers"

## 2022-07-05 ENCOUNTER — Encounter: Payer: Self-pay | Admitting: Rehabilitative and Restorative Service Providers"

## 2022-07-05 DIAGNOSIS — M6281 Muscle weakness (generalized): Secondary | ICD-10-CM

## 2022-07-05 DIAGNOSIS — M25512 Pain in left shoulder: Secondary | ICD-10-CM

## 2022-07-05 DIAGNOSIS — M25612 Stiffness of left shoulder, not elsewhere classified: Secondary | ICD-10-CM

## 2022-07-05 DIAGNOSIS — G8929 Other chronic pain: Secondary | ICD-10-CM

## 2022-07-05 DIAGNOSIS — R278 Other lack of coordination: Secondary | ICD-10-CM | POA: Diagnosis not present

## 2022-07-12 ENCOUNTER — Telehealth: Payer: Self-pay | Admitting: Physician Assistant

## 2022-07-12 NOTE — Telephone Encounter (Signed)
Patient is requesting RTW note with restrictions recommended by Nate as he is discharged from OT. "Avoid lifting shoulder height repetitively ,avoid repetitively lifting greater than 40 lbs." Please call when ready [phone]

## 2022-07-13 ENCOUNTER — Encounter: Payer: BC Managed Care – PPO | Admitting: Rehabilitative and Restorative Service Providers"

## 2022-07-16 NOTE — Telephone Encounter (Signed)
Note placed up front and patient notified.

## 2023-06-05 DIAGNOSIS — J209 Acute bronchitis, unspecified: Secondary | ICD-10-CM | POA: Diagnosis not present

## 2023-06-05 DIAGNOSIS — R051 Acute cough: Secondary | ICD-10-CM | POA: Diagnosis not present

## 2023-10-01 IMAGING — MR MR SHOULDER*L* W/O CM
4 of 5 series · 28 of 40 positions shown · non-contrast
Comparison: MRI left shoulder 04/06/2011

CLINICAL DATA: Shoulder pain.  Rotator cuff disorder suspected.

EXAM:
MRI OF THE LEFT SHOULDER WITHOUT CONTRAST
TECHNIQUE: Multiplanar, multisequence MR imaging of the shoulder was performed.
No intravenous contrast was administered.

[Series 6: T2 fat-sat · axial · left · 4.0mm · 0.27mm/px · z∈[-42,+73]mm · 8 of 25 slices shown (1 of 3)]
[im 1/25]
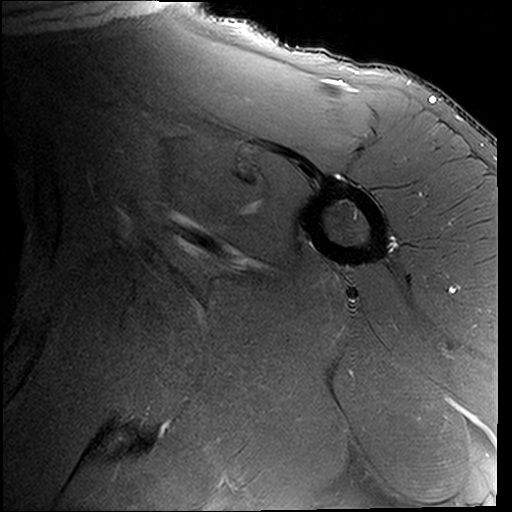
[im 4/25]
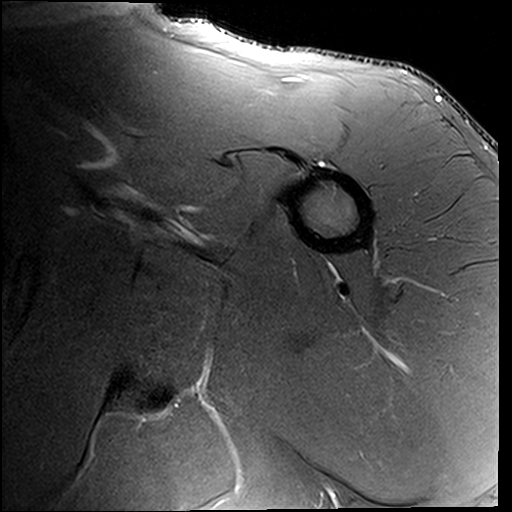
[im 7/25]
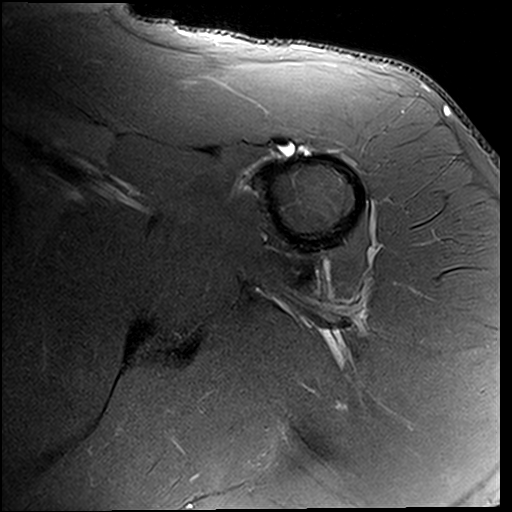
[im 11/25]
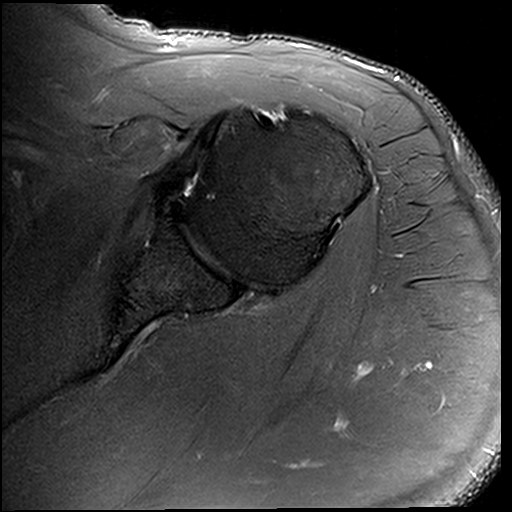
[im 14/25]
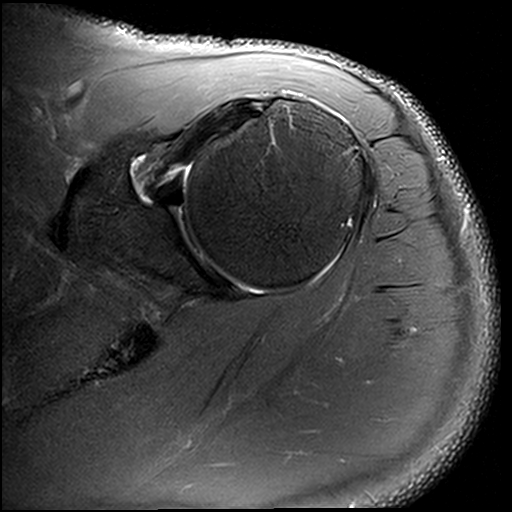
[im 18/25]
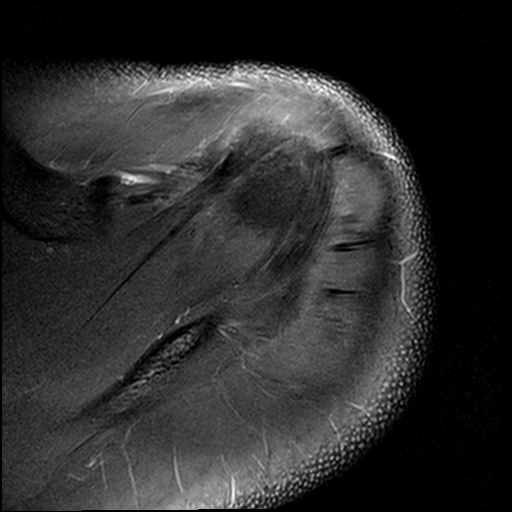
[im 21/25]
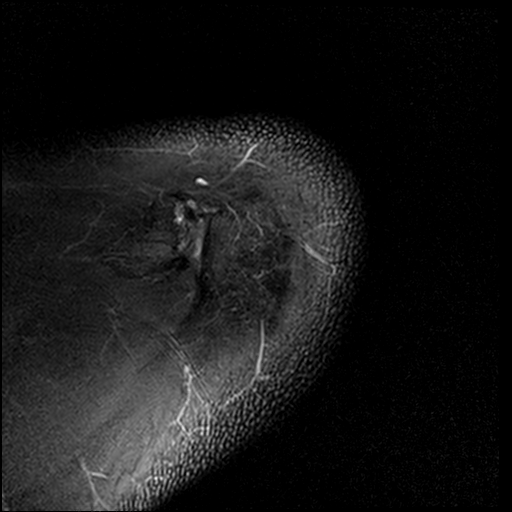
[im 25/25]
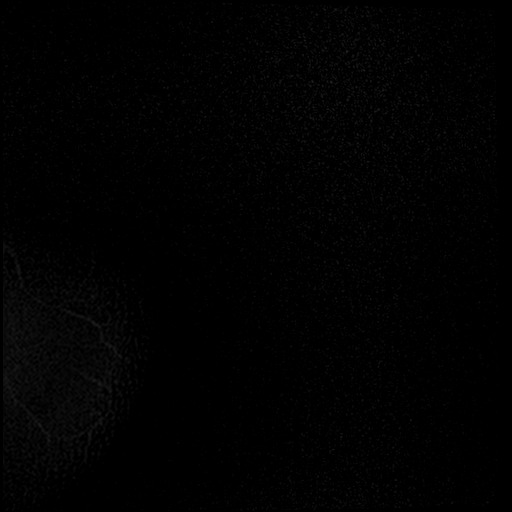

[Series 7: T2 fat-sat · oblique · left · 4.0mm · 0.27mm/px · 7 of 19 slices shown (2 of 3)]
[im 1/19]
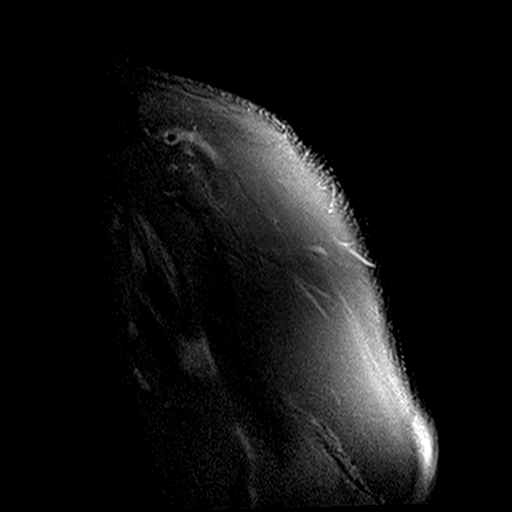
[im 4/19]
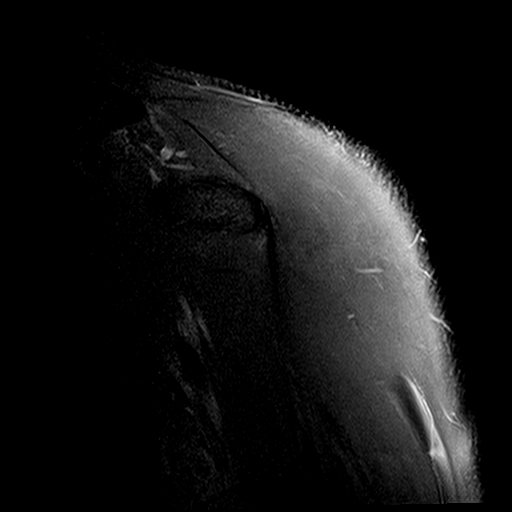
[im 7/19]
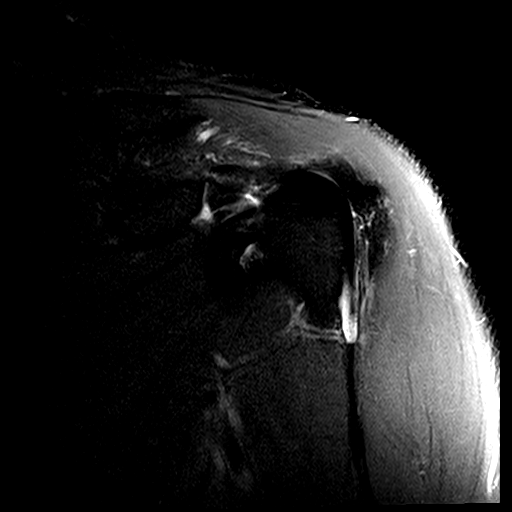
[im 10/19]
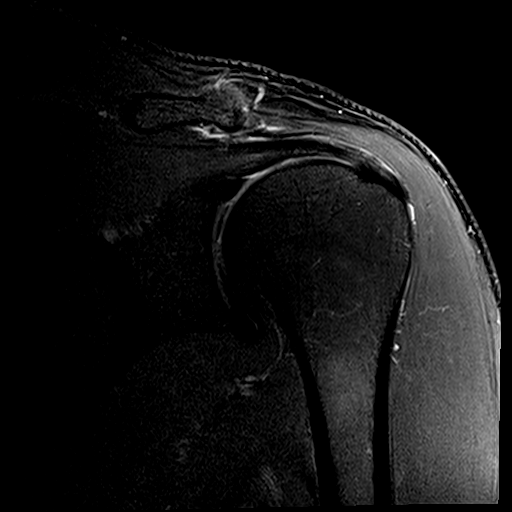
[im 13/19]
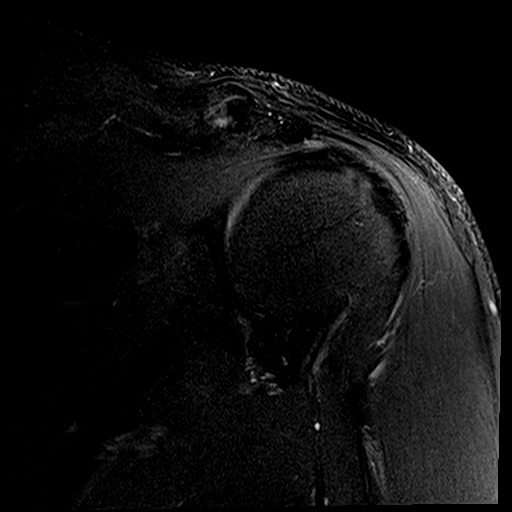
[im 16/19]
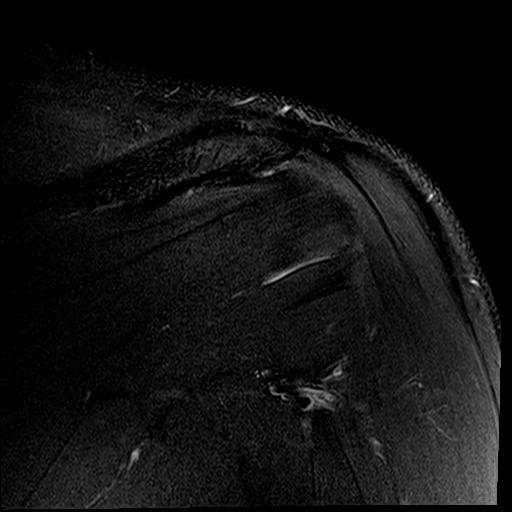
[im 19/19]
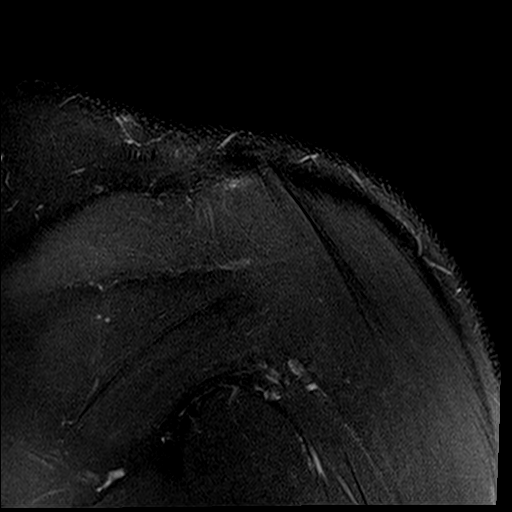

[Series 8: PD · oblique · left · 4.0mm · 0.55mm/px · 9 of 25 slices shown]
[im 1/25]
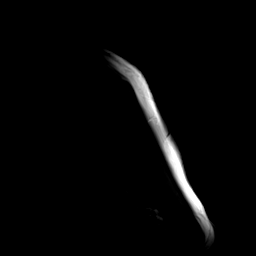
[im 4/25]
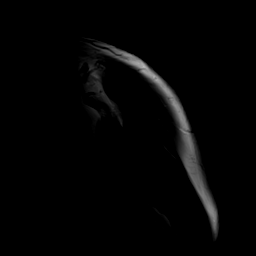
[im 7/25]
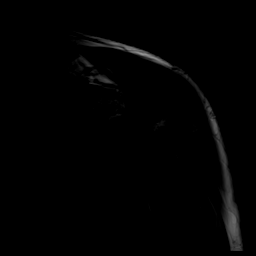
[im 10/25]
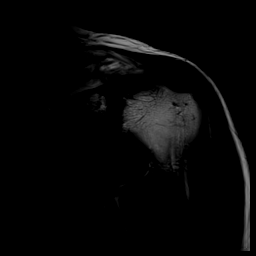
[im 13/25]
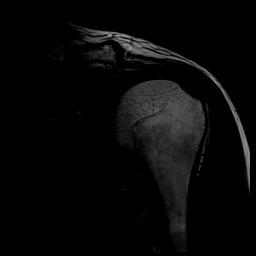
[im 16/25]
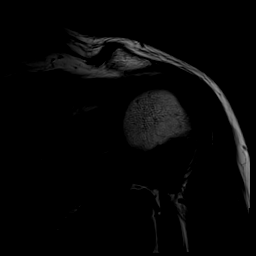
[im 19/25]
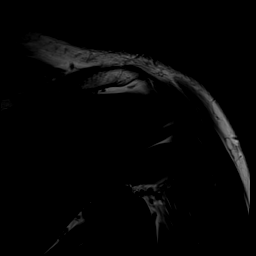
[im 22/25]
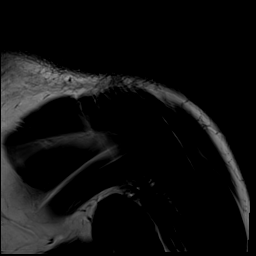
[im 25/25]
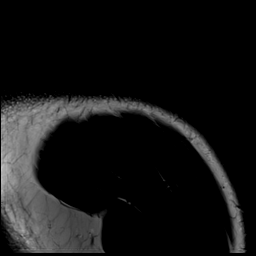

[Series 9: T2 fat-sat · oblique · left · 4.0mm · 0.55mm/px · 4 of 21 slices shown (3 of 3)]
[im 1/21]
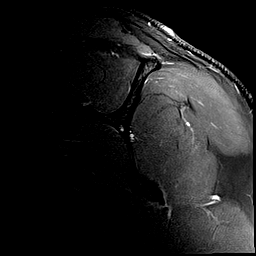
[im 3/21]
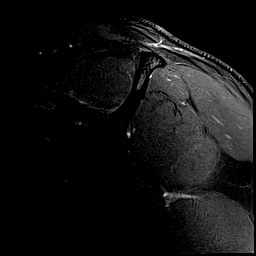
[im 12/21]
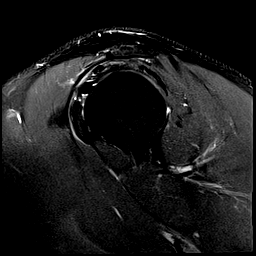
[im 18/21]
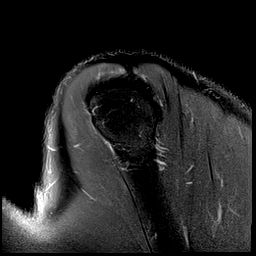

[28 of 40 positions shown; findings below may reference images not displayed]

FINDINGS: Rotator cuff: Mild supraspinatus and infraspinatus intermediate T2
signal tendinosis, greatest at the anterior supraspinatus tendon
footprint. This is mildly worsened from remote prior MRI. Thin
linear intermediate T2 signal in the longitudinal axis along the far
anterior supraspinatus tendon footprint fibers (coronal series 7,
image 12), focal tendinosis versus a tiny chronic partial-thickness
tear. No tendon retraction. The subscapularis and teres minor are
intact.

Muscles: No rotator cuff muscle atrophy, fatty infiltration, or
edema.

Biceps long head: The intra-articular long head of the biceps tendon
is intact.

Acromioclavicular Joint: There are mild degenerative changes of the
acromioclavicular joint including joint space narrowing, subchondral
marrow edema, and peripheral osteophytosis. Type II acromion. Trace
fluid within the subacromial/subdeltoid bursa.

Glenohumeral Joint: Mild thinning of the glenoid and humeral head
cartilage.

Labrum: Grossly intact, but evaluation is limited by lack of
intraarticular fluid.

Bones:  No acute fracture.

Other: None.
IMPRESSION: 1. Mild supraspinatus greater than infraspinatus tendinosis. This is
greatest within the anterior supraspinatus. No fluid bright tear is
seen, however there may be a thin, linear, tiny partial-thickness
anterior supraspinatus midsubstance tear. No tendon retraction.
2. Mild degenerative changes of the acromioclavicular and
glenohumeral joints.
